# Patient Record
Sex: Female | Born: 1975 | Race: White | Hispanic: No | Marital: Married | Smoking: Never smoker
Health system: Southern US, Community
[De-identification: ages and names within clinical notes are randomized; demographics above are authoritative.]

## PROBLEM LIST (undated history)

## (undated) DIAGNOSIS — N200 Calculus of kidney: Secondary | ICD-10-CM

## (undated) HISTORY — DX: Calculus of kidney: N20.0

## (undated) HISTORY — PX: WISDOM TOOTH EXTRACTION: SHX21

---

## 1999-11-28 ENCOUNTER — Other Ambulatory Visit: Admission: RE | Admit: 1999-11-28 | Discharge: 1999-11-28 | Payer: Self-pay | Admitting: *Deleted

## 2000-12-24 ENCOUNTER — Other Ambulatory Visit: Admission: RE | Admit: 2000-12-24 | Discharge: 2000-12-24 | Payer: Self-pay | Admitting: *Deleted

## 2001-12-23 ENCOUNTER — Other Ambulatory Visit: Admission: RE | Admit: 2001-12-23 | Discharge: 2001-12-23 | Payer: Self-pay | Admitting: *Deleted

## 2003-02-23 ENCOUNTER — Inpatient Hospital Stay (HOSPITAL_COMMUNITY): Admission: AD | Admit: 2003-02-23 | Discharge: 2003-02-24 | Payer: Self-pay | Admitting: Obstetrics and Gynecology

## 2003-03-16 ENCOUNTER — Encounter: Admission: RE | Admit: 2003-03-16 | Discharge: 2003-03-16 | Payer: Self-pay | Admitting: Obstetrics and Gynecology

## 2003-04-15 ENCOUNTER — Inpatient Hospital Stay (HOSPITAL_COMMUNITY): Admission: AD | Admit: 2003-04-15 | Discharge: 2003-04-15 | Payer: Self-pay | Admitting: *Deleted

## 2003-04-20 ENCOUNTER — Inpatient Hospital Stay (HOSPITAL_COMMUNITY): Admission: AD | Admit: 2003-04-20 | Discharge: 2003-04-20 | Payer: Self-pay | Admitting: Obstetrics and Gynecology

## 2003-04-26 ENCOUNTER — Inpatient Hospital Stay (HOSPITAL_COMMUNITY): Admission: RE | Admit: 2003-04-26 | Discharge: 2003-04-28 | Payer: Self-pay | Admitting: Obstetrics and Gynecology

## 2003-06-08 ENCOUNTER — Other Ambulatory Visit: Admission: RE | Admit: 2003-06-08 | Discharge: 2003-06-08 | Payer: Self-pay | Admitting: Obstetrics and Gynecology

## 2004-08-21 ENCOUNTER — Other Ambulatory Visit: Admission: RE | Admit: 2004-08-21 | Discharge: 2004-08-21 | Payer: Self-pay | Admitting: Obstetrics and Gynecology

## 2005-01-02 ENCOUNTER — Encounter: Admission: RE | Admit: 2005-01-02 | Discharge: 2005-01-02 | Payer: Self-pay | Admitting: Obstetrics and Gynecology

## 2005-01-30 ENCOUNTER — Inpatient Hospital Stay (HOSPITAL_COMMUNITY): Admission: AD | Admit: 2005-01-30 | Discharge: 2005-01-30 | Payer: Self-pay | Admitting: Obstetrics and Gynecology

## 2005-02-10 ENCOUNTER — Inpatient Hospital Stay (HOSPITAL_COMMUNITY): Admission: AD | Admit: 2005-02-10 | Discharge: 2005-02-10 | Payer: Self-pay | Admitting: Obstetrics and Gynecology

## 2005-02-16 ENCOUNTER — Inpatient Hospital Stay (HOSPITAL_COMMUNITY): Admission: RE | Admit: 2005-02-16 | Discharge: 2005-02-18 | Payer: Self-pay | Admitting: Obstetrics and Gynecology

## 2005-04-09 ENCOUNTER — Other Ambulatory Visit: Admission: RE | Admit: 2005-04-09 | Discharge: 2005-04-09 | Payer: Self-pay | Admitting: Obstetrics and Gynecology

## 2009-05-04 ENCOUNTER — Ambulatory Visit (HOSPITAL_COMMUNITY): Admission: RE | Admit: 2009-05-04 | Discharge: 2009-05-04 | Payer: Self-pay | Admitting: Obstetrics and Gynecology

## 2009-05-25 ENCOUNTER — Ambulatory Visit (HOSPITAL_COMMUNITY): Admission: RE | Admit: 2009-05-25 | Discharge: 2009-05-25 | Payer: Self-pay | Admitting: Obstetrics and Gynecology

## 2009-06-22 ENCOUNTER — Ambulatory Visit (HOSPITAL_COMMUNITY): Admission: RE | Admit: 2009-06-22 | Discharge: 2009-06-22 | Payer: Self-pay | Admitting: Obstetrics and Gynecology

## 2009-07-13 ENCOUNTER — Ambulatory Visit (HOSPITAL_COMMUNITY): Admission: RE | Admit: 2009-07-13 | Discharge: 2009-07-13 | Payer: Self-pay | Admitting: Obstetrics and Gynecology

## 2009-08-24 ENCOUNTER — Ambulatory Visit (HOSPITAL_COMMUNITY): Admission: RE | Admit: 2009-08-24 | Discharge: 2009-08-24 | Payer: Self-pay | Admitting: Obstetrics and Gynecology

## 2009-09-25 ENCOUNTER — Inpatient Hospital Stay (HOSPITAL_COMMUNITY): Admission: AD | Admit: 2009-09-25 | Discharge: 2009-09-26 | Payer: Self-pay | Admitting: Obstetrics and Gynecology

## 2009-09-30 ENCOUNTER — Encounter (INDEPENDENT_AMBULATORY_CARE_PROVIDER_SITE_OTHER): Payer: Self-pay | Admitting: Obstetrics and Gynecology

## 2009-09-30 ENCOUNTER — Inpatient Hospital Stay (HOSPITAL_COMMUNITY): Admission: RE | Admit: 2009-09-30 | Discharge: 2009-10-03 | Payer: Self-pay | Admitting: Obstetrics and Gynecology

## 2010-05-28 ENCOUNTER — Encounter: Payer: Self-pay | Admitting: Obstetrics and Gynecology

## 2010-07-24 LAB — CBC
HCT: 27.4 % — ABNORMAL LOW (ref 36.0–46.0)
MCHC: 34.1 g/dL (ref 30.0–36.0)
MCHC: 34.6 g/dL (ref 30.0–36.0)
MCHC: 34.6 g/dL (ref 30.0–36.0)
MCV: 85.4 fL (ref 78.0–100.0)
MCV: 85.4 fL (ref 78.0–100.0)
MCV: 86.3 fL (ref 78.0–100.0)
Platelets: 91 10*3/uL — ABNORMAL LOW (ref 150–400)
Platelets: 98 10*3/uL — ABNORMAL LOW (ref 150–400)
RBC: 3.05 MIL/uL — ABNORMAL LOW (ref 3.87–5.11)
RBC: 3.85 MIL/uL — ABNORMAL LOW (ref 3.87–5.11)
WBC: 7.6 10*3/uL (ref 4.0–10.5)
WBC: 9.3 10*3/uL (ref 4.0–10.5)

## 2010-07-24 LAB — BASIC METABOLIC PANEL
CO2: 23 mEq/L (ref 19–32)
Calcium: 8.7 mg/dL (ref 8.4–10.5)
Creatinine, Ser: 0.41 mg/dL (ref 0.4–1.2)
GFR calc non Af Amer: >60 mL/min (ref >60)
Glucose, Bld: 68 mg/dL — ABNORMAL LOW (ref 70–99)

## 2010-07-24 LAB — PLATELET COUNT: Platelets: 107 10*3/uL — ABNORMAL LOW (ref 150–400)

## 2010-07-24 LAB — GLUCOSE, CAPILLARY: Glucose-Capillary: 97 mg/dL (ref 70–99)

## 2010-07-24 LAB — RPR: RPR Ser Ql: NONREACTIVE

## 2010-09-22 NOTE — Discharge Summary (Signed)
Wendy Peck, Wendy Peck                 ACCOUNT NO.:  [id number]   MEDICAL RECORD NO.:  [id number]          PATIENT TYPE:  INP   LOCATION:  9120                          FACILITY:  [facility]   PHYSICIAN:  Guy Sandifer. Henderson Cloud, M.D. DATE OF BIRTH:  June 05, 1975   DATE OF ADMISSION:  02/16/2005  DATE OF DISCHARGE:  02/18/2005                                 DISCHARGE SUMMARY   ADMITTING DIAGNOSES:  1.  Intrauterine pregnancy at 37 weeks estimated gestational age.  2.  History of coccygeal fracture, desires operative delivery.  3.  Advanced cervical dilatation.  4.  Thrombocytopenia   DISCHARGE DIAGNOSES:  1.  Status post low transverse cesarean section.  2.  Viable female infant.   PROCEDURE:  Primary low transverse cesarean section.   REASON FOR ADMISSION:  Please see written H&P.   HOSPITAL COURSE:  The patient is a 28-year gravida 2 para 1 that had been  seen in the office and was noted to be 4 cm dilated. The patient had had a  history of a coccygeal fracture with her last pregnancy and refused labor at  this time. The patient was also noted to have low platelet count with a  history of early pregnancy-induced hypertension with her previous pregnancy.  Vital signs were otherwise stable. The patient was now admitted to  [facility] for cesarean delivery. The patient was taken to  the operating room where spinal anesthesia was administered without  difficulty. A low transverse incision was made with the delivery of a viable  female infant weighing 9 pounds 0 ounces with Apgars of 9 at one minute and  9 at five minutes. The patient tolerated the procedure well and was taken to  the recovery room in stable condition. On postoperative day #1 the patient  was tolerating liquids without nausea or vomiting. Vital signs were stable  with blood pressure 108/62 to 128/86. Lungs were clear to auscultation.  Abdomen was soft with good return of bowel function. Laboratory findings  revealed hemoglobin of 9.1; platelet count was up to 131,000. On  postoperative day #2 the patient was feeling well and desired early  discharge. Vital signs were stable, she was afebrile. Incision was clean,  dry and intact. Abdomen was soft. Fundus firm and nontender. Discharge  instructions were reviewed and the patient was discharged home.   CONDITION ON DISCHARGE:  Good.   DIET:  Regular as tolerated.   ACTIVITY:  No heavy lifting, no driving x2 weeks, no vaginal entry.   FOLLOW-UP:  The patient is to follow up in the office in 1-2 weeks for an  incision check. She is to call for temperature greater than 100 degrees,  persistent nausea and vomiting, heavy vaginal bleeding, and/or redness or  drainage from the incisional site.   DISCHARGE MEDICATIONS:  1.  Percocet 5/325 #30 one p.o. q.4-6h. p.r.n.  2.  Motrin 600 mg q.6h. p.r.n.  3.  Prenatal vitamins one p.o. daily.  4.  Colace one p.o. daily p.r.n.      Julio Sicks, N.P.      Guy Sandifer. Henderson Cloud,  M.D.  Electronically Signed    CC/MEDQ  D:  03/12/2005  T:  03/12/2005  Job:  [id number]

## 2010-09-22 NOTE — Op Note (Signed)
Wendy Peck, Wendy Peck                 ACCOUNT NO.:  [id number]   MEDICAL RECORD NO.:  [id number]          PATIENT TYPE:  INP   LOCATION:  9120                          FACILITY:  [facility]   PHYSICIAN:  Michelle L. Grewal, M.D.DATE OF BIRTH:  [birth year]   DATE OF PROCEDURE:  02/16/2005  DATE OF DISCHARGE:                                 OPERATIVE REPORT   PREOPERATIVE DIAGNOSES:  1.  Intrauterine pregnancy at 37 weeks.  2.  History of coccygeal fracture.  3.  Advanced cervical dilation.  4.  Thrombocytopenia.   POSTOPERATIVE DIAGNOSES:  1.  Intrauterine pregnancy at 37 weeks.  2.  History of coccygeal fracture.  3.  Advanced cervical dilation.  4.  Thrombocytopenia.   PROCEDURE:  Primary low transverse cesarean section.   SURGEON:  Michelle L. Vincente Poli, M.D.   ANESTHESIA:  Spinal.   SPECIMENS:  Female infant in cephalic presentation, Apgars 9 at one minute  and 9 at five minutes.   ESTIMATED BLOOD LOSS:  500 cc.   DRAINS:  Foley.   COMPLICATIONS:  None.   PATHOLOGY.:  None.   PROCEDURE:  The patient was taken to the operating room.  She was given  spinal without incident.  She was prepped and draped in usual sterile  fashion.  A Foley catheter was inserted.  A sterile drape was applied.  A  low transverse was incision made and carried down to the fascia.  The fascia  was scored in the midline and extended laterally.  The rectus muscles were  separated in the midline and the peritoneum was entered bluntly and the  peritoneal incision was then stretched.  The bladder blade was inserted, the  lower uterine segment was identified, and the bladder flap was created  sharply and then digitally.  The bladder blade was then readjusted and a low  transverse incision was made in the uterus.  The uterus was entered using a  hemostat.  The amniotic fluid was clear.  The baby was in cephalic  presentation, was delivered easily with a vacuum extractor.  It was a female  infant, Apgars 9 at  one minute and 9 at five minutes.  The cord was clamped  and cut.  The placenta was manually removed and noted to be normal and  intact with a three-vessel cord.  The uterus was cleared of all clots and  debris.  The uterine incision was closed in a single layer using 0 chromic  in a continuous running locked stitch.  Irrigation was performed.  Hemostasis was again noted.  The peritoneum was closed using 0 Vicryl in a  continuous running stitch  and the rectus muscles were reapproximated using the same 0 Vicryl.  The  fascia was closed using 0 Vicryl in a continuous running stitch.  After  irrigation of the subcutaneous layer, the skin was closed with staples.  All  sponge, lap and instrument counts were correct x2.  The patient went to  recovery room in stable condition.      Michelle L. Vincente Poli, M.D.  Electronically Signed     MLG/MEDQ  D:  02/16/2005  T:  02/17/2005  Job:  [id number]

## 2012-01-14 IMAGING — US US OB FOLLOW-UP
1 series · 14 of 28 positions shown · non-contrast
Comparison: none

OBSTETRICAL ULTRASOUND:
 This ultrasound was performed in The [HOSPITAL], and the [facility] report will be stored to [REDACTED] PACS.  This report is also available in [HOSPITAL]?s accessANYware.

[Series 1: us ob follow-up · 38 acquisitions, 14 frames shown]
[im 2/38]
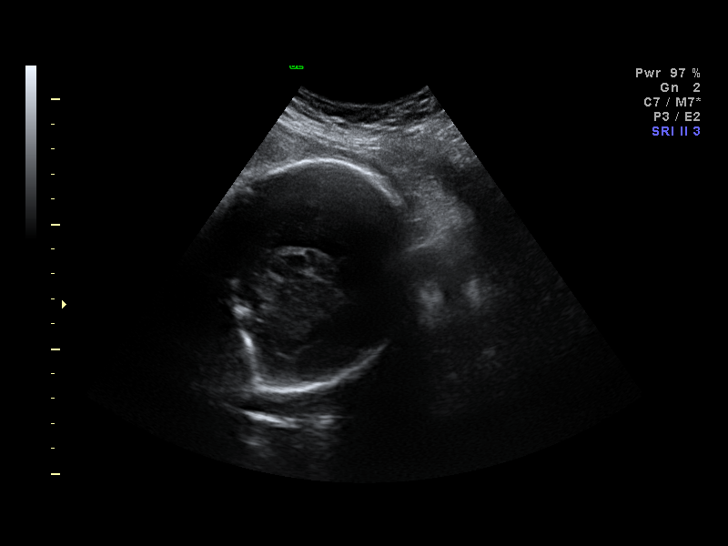
[im 5/38]
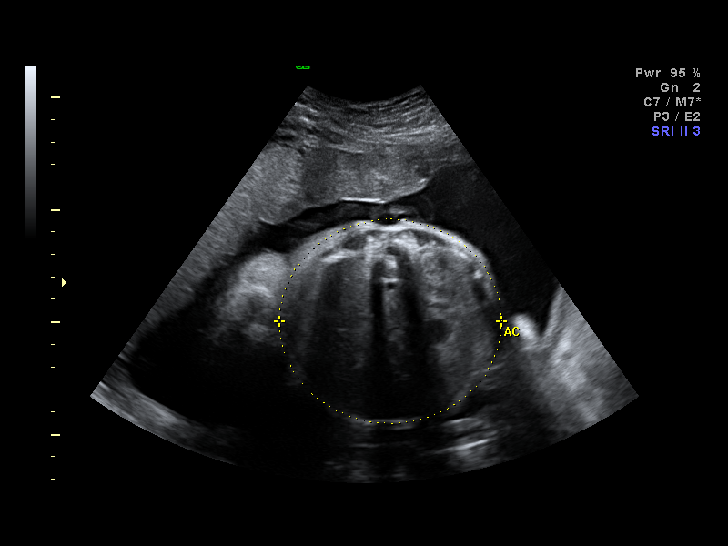
[im 7/38]
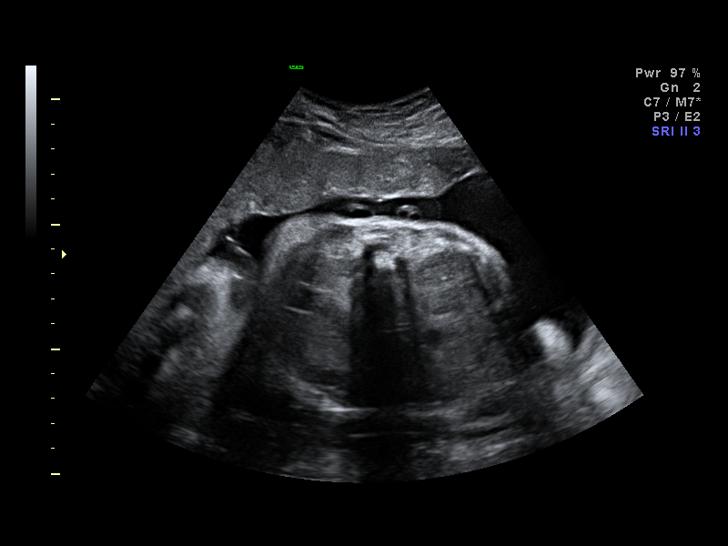
[im 10/38]
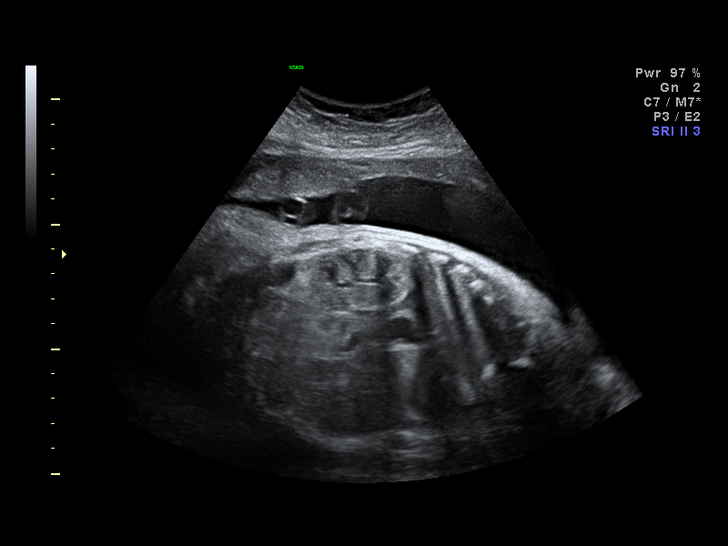
[im 13/38]
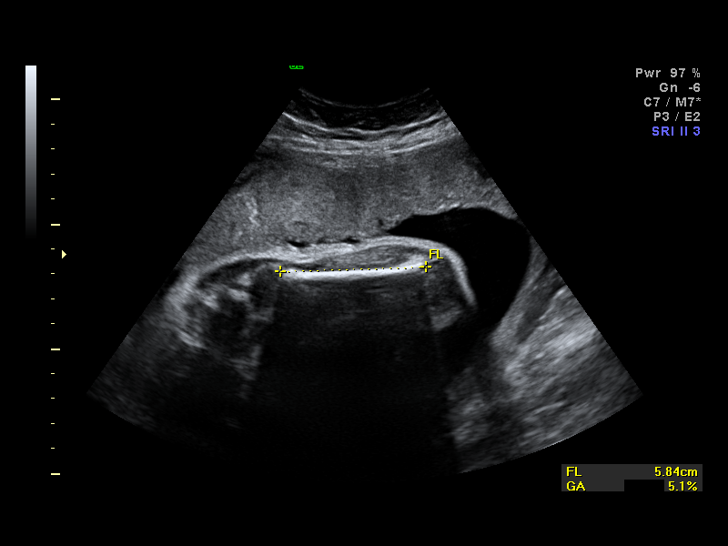
[im 16/38]
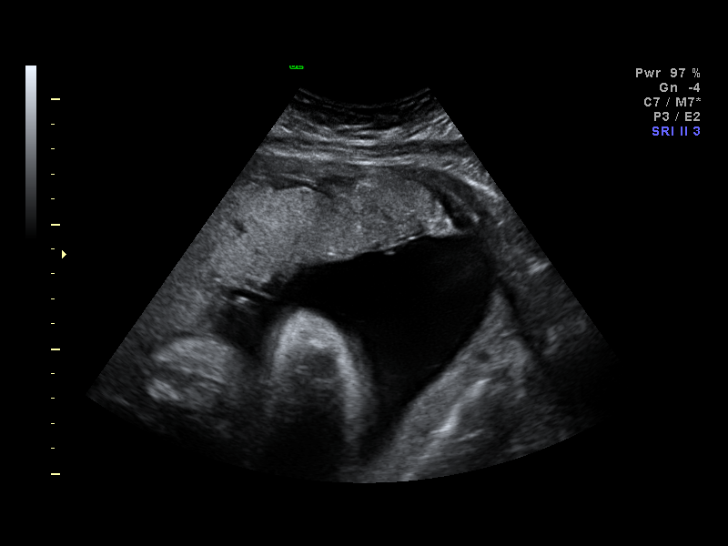
[im 18/38]
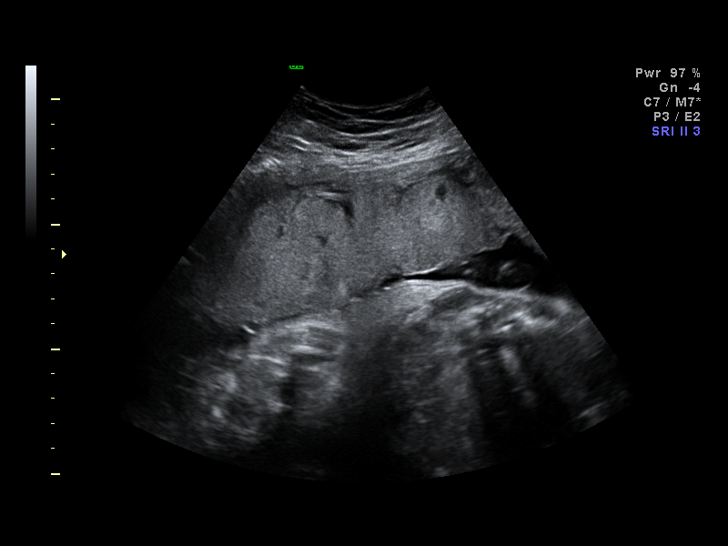
[im 21/38]
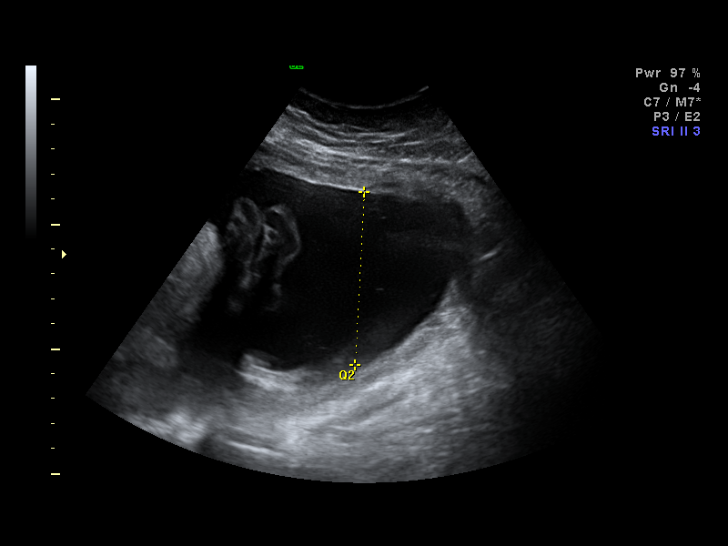
[im 24/38]
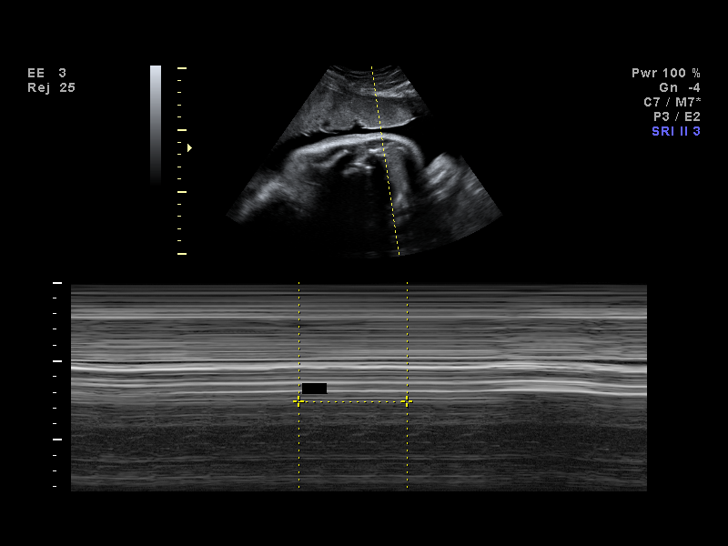
[im 27/38]
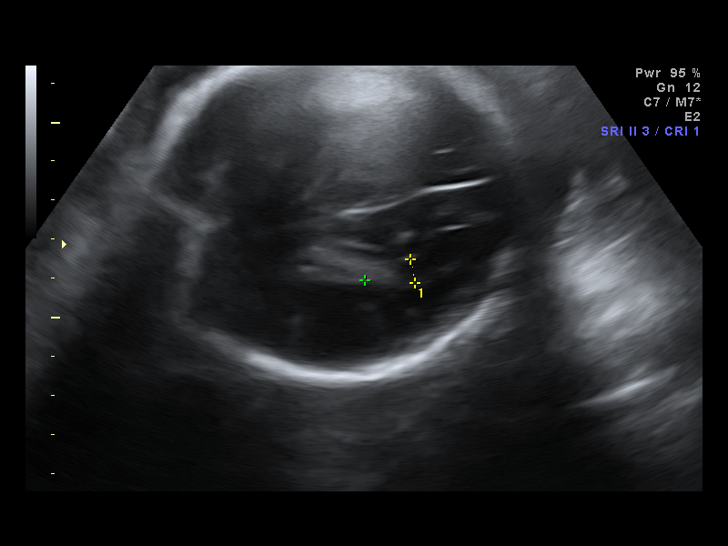
[im 29/38]
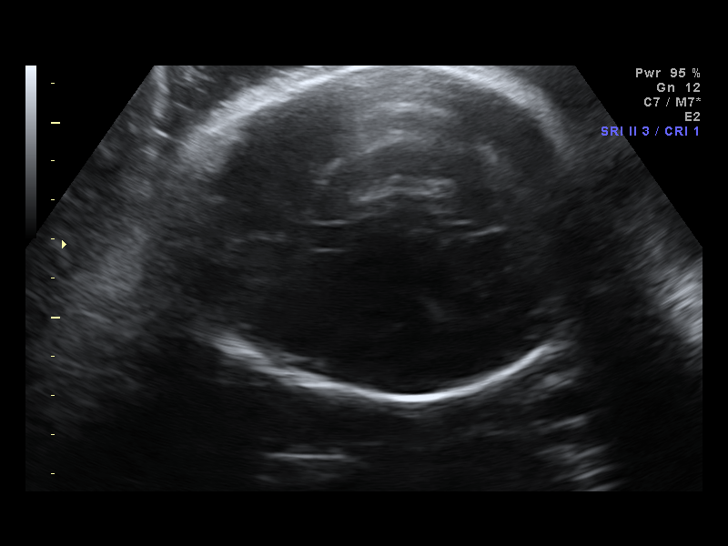
[im 32/38]
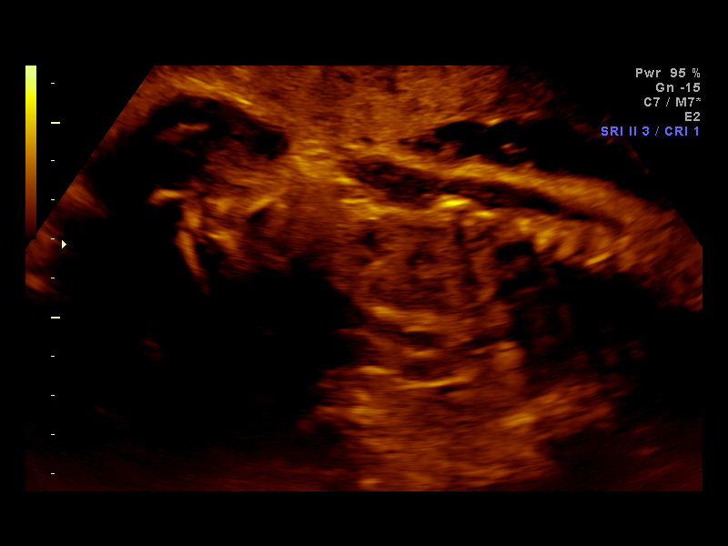
[im 35/38]
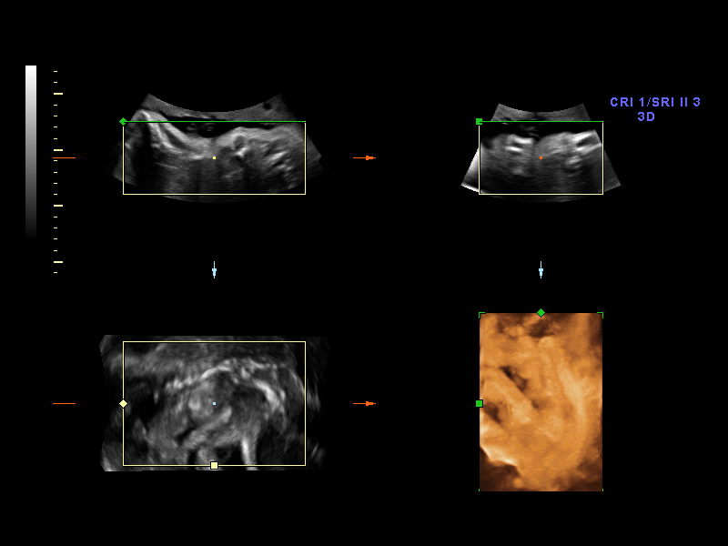
[im 38/38]
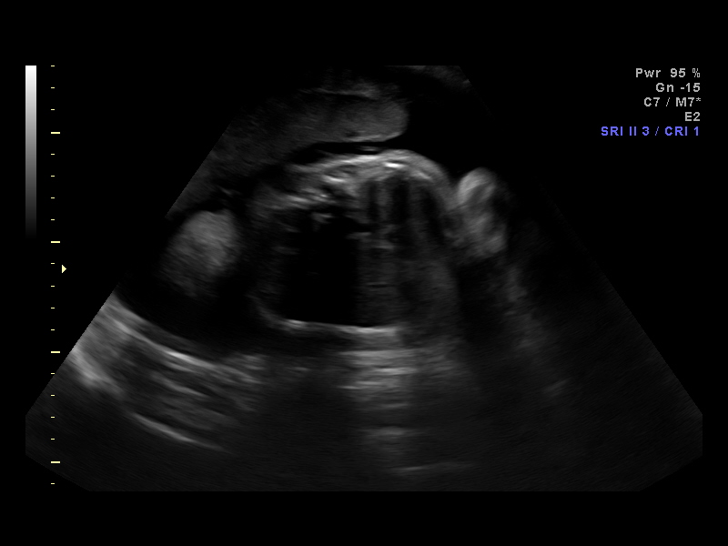

[14 of 28 positions shown; findings below may reference images not displayed]

IMPRESSION: [facility] has also been faxed to the ordering physician.

## 2012-04-07 ENCOUNTER — Other Ambulatory Visit: Payer: Self-pay | Admitting: Obstetrics and Gynecology

## 2013-05-28 ENCOUNTER — Other Ambulatory Visit: Payer: Self-pay | Admitting: Obstetrics and Gynecology

## 2014-06-02 ENCOUNTER — Other Ambulatory Visit: Payer: Self-pay | Admitting: Obstetrics and Gynecology

## 2014-06-03 LAB — CYTOLOGY - PAP

## 2018-09-05 ENCOUNTER — Emergency Department (HOSPITAL_COMMUNITY)
Admission: EM | Admit: 2018-09-05 | Discharge: 2018-09-05 | Disposition: A | Discharge status: Home / Self Care | Payer: No Typology Code available for payment source | Attending: Emergency Medicine | Admitting: Emergency Medicine

## 2018-09-05 ENCOUNTER — Encounter (HOSPITAL_COMMUNITY): Payer: Self-pay | Admitting: Emergency Medicine

## 2018-09-05 ENCOUNTER — Emergency Department (HOSPITAL_COMMUNITY): Payer: No Typology Code available for payment source

## 2018-09-05 ENCOUNTER — Other Ambulatory Visit: Payer: Self-pay

## 2018-09-05 DIAGNOSIS — N130 Hydronephrosis with ureteropelvic junction obstruction: Secondary | ICD-10-CM | POA: Diagnosis not present

## 2018-09-05 DIAGNOSIS — R112 Nausea with vomiting, unspecified: Secondary | ICD-10-CM | POA: Diagnosis not present

## 2018-09-05 DIAGNOSIS — N200 Calculus of kidney: Secondary | ICD-10-CM

## 2018-09-05 DIAGNOSIS — R1031 Right lower quadrant pain: Secondary | ICD-10-CM | POA: Diagnosis present

## 2018-09-05 DIAGNOSIS — N202 Calculus of kidney with calculus of ureter: Secondary | ICD-10-CM | POA: Diagnosis not present

## 2018-09-05 DIAGNOSIS — N134 Hydroureter: Secondary | ICD-10-CM | POA: Diagnosis not present

## 2018-09-05 LAB — COMPREHENSIVE METABOLIC PANEL
ALT: 22 U/L (ref 0–44)
AST: 19 U/L (ref 15–41)
Albumin: 4.4 g/dL (ref 3.5–5.0)
Alkaline Phosphatase: 49 U/L (ref 38–126)
Anion gap: 14 (ref 5–15)
BUN: 13 mg/dL (ref 6–20)
CO2: 22 mmol/L (ref 22–32)
Calcium: 9.6 mg/dL (ref 8.9–10.3)
Chloride: 103 mmol/L (ref 98–111)
Creatinine, Ser: 0.92 mg/dL (ref 0.44–1.00)
GFR calc Af Amer: >60 mL/min (ref >60)
GFR calc non Af Amer: >60 mL/min (ref >60)
Glucose, Bld: 191 mg/dL — ABNORMAL HIGH (ref 70–99)
Potassium: 3.7 mmol/L (ref 3.5–5.1)
Sodium: 139 mmol/L (ref 135–145)
Total Bilirubin: 0.4 mg/dL (ref 0.3–1.2)
Total Protein: 7.2 g/dL (ref 6.5–8.1)

## 2018-09-05 LAB — CBC WITH DIFFERENTIAL/PLATELET
Abs Immature Granulocytes: 0.04 10*3/uL (ref 0.00–0.07)
Basophils Absolute: 0 10*3/uL (ref 0.0–0.1)
Basophils Relative: 0 %
Eosinophils Absolute: 0 10*3/uL (ref 0.0–0.5)
Eosinophils Relative: 0 %
HCT: 43.7 % (ref 36.0–46.0)
Hemoglobin: 14.1 g/dL (ref 12.0–15.0)
Immature Granulocytes: 0 %
Lymphocytes Relative: 14 %
Lymphs Abs: 1.3 10*3/uL (ref 0.7–4.0)
MCH: 28.6 pg (ref 26.0–34.0)
MCHC: 32.3 g/dL (ref 30.0–36.0)
MCV: 88.6 fL (ref 80.0–100.0)
Monocytes Absolute: 0.5 10*3/uL (ref 0.1–1.0)
Monocytes Relative: 5 %
Neutro Abs: 7.2 10*3/uL (ref 1.7–7.7)
Neutrophils Relative %: 81 %
Platelets: 183 10*3/uL (ref 150–400)
RBC: 4.93 MIL/uL (ref 3.87–5.11)
RDW: 13.2 % (ref 11.5–15.5)
WBC: 9 10*3/uL (ref 4.0–10.5)
nRBC: 0 % (ref 0.0–0.2)

## 2018-09-05 LAB — WET PREP, GENITAL
Clue Cells Wet Prep HPF POC: NONE SEEN
Trich, Wet Prep: NONE SEEN
Yeast Wet Prep HPF POC: NONE SEEN

## 2018-09-05 LAB — LIPASE, BLOOD: Lipase: 37 U/L (ref 11–51)

## 2018-09-05 LAB — GC/CHLAMYDIA PROBE AMP (~~LOC~~) NOT AT ARMC
Chlamydia: NEGATIVE
Neisseria Gonorrhea: NEGATIVE

## 2018-09-05 LAB — URINALYSIS, ROUTINE W REFLEX MICROSCOPIC
Bacteria, UA: NONE SEEN
Bilirubin Urine: NEGATIVE
Glucose, UA: 150 mg/dL — AB
Ketones, ur: 5 mg/dL — AB
Leukocytes,Ua: NEGATIVE
Nitrite: NEGATIVE
Protein, ur: NEGATIVE mg/dL
Specific Gravity, Urine: 1.012 (ref 1.005–1.030)
pH: 7 (ref 5.0–8.0)

## 2018-09-05 LAB — I-STAT BETA HCG BLOOD, ED (MC, WL, AP ONLY): I-stat hCG, quantitative: <5 m[IU]/mL (ref <5)

## 2018-09-05 MED ORDER — TAMSULOSIN HCL 0.4 MG PO CAPS: 0.4000 mg | ORAL_CAPSULE | Freq: Every day | ORAL | 0 refills | Status: DC

## 2018-09-05 MED ORDER — HYDROMORPHONE HCL 1 MG/ML IJ SOLN
1.0000 mg | Freq: Once | INTRAMUSCULAR | Status: AC
  Administered 2018-09-05: 07:00:00 1 mg via INTRAVENOUS
  Filled 2018-09-05: qty 1

## 2018-09-05 MED ORDER — ONDANSETRON 4 MG PO TBDP: 4.0000 mg | ORAL_TABLET | Freq: Four times a day (QID) | ORAL | 0 refills | Status: DC | PRN

## 2018-09-05 MED ORDER — SODIUM CHLORIDE 0.9 % IV BOLUS (SEPSIS)
1000.0000 mL | Freq: Once | INTRAVENOUS | Status: AC
  Administered 2018-09-05: 05:00:00 1000 mL via INTRAVENOUS

## 2018-09-05 MED ORDER — ONDANSETRON HCL 4 MG/2ML IJ SOLN
4.0000 mg | Freq: Once | INTRAMUSCULAR | Status: AC
  Administered 2018-09-05: 4 mg via INTRAVENOUS
  Filled 2018-09-05: qty 2

## 2018-09-05 MED ORDER — OXYCODONE-ACETAMINOPHEN 5-325 MG PO TABS: 2.0000 | ORAL_TABLET | Freq: Four times a day (QID) | ORAL | 0 refills | Status: DC | PRN

## 2018-09-05 MED ORDER — HYDROMORPHONE HCL 1 MG/ML IJ SOLN
1.0000 mg | Freq: Once | INTRAMUSCULAR | Status: AC
  Administered 2018-09-05: 05:00:00 1 mg via INTRAVENOUS
  Filled 2018-09-05: qty 1

## 2018-09-05 MED ORDER — IBUPROFEN 800 MG PO TABS: 800.0000 mg | ORAL_TABLET | Freq: Three times a day (TID) | ORAL | 0 refills | Status: DC | PRN

## 2018-09-05 MED ORDER — KETOROLAC TROMETHAMINE 30 MG/ML IJ SOLN
30.0000 mg | Freq: Once | INTRAMUSCULAR | Status: AC
  Administered 2018-09-05: 05:00:00 30 mg via INTRAVENOUS
  Filled 2018-09-05: qty 1

## 2018-09-05 NOTE — ED Notes (Signed)
Patient transported to CT 

## 2018-09-05 NOTE — ED Notes (Signed)
Pt reports 2/10 pain after Toradol injection

## 2018-09-05 NOTE — ED Notes (Signed)
Patient verbalizes understanding of discharge instructions. Opportunity for questioning and answers were provided. Pt discharged from ED. 

## 2018-09-05 NOTE — ED Triage Notes (Signed)
Pt in with c/o RLQ pain since 2300 last night, states pain has gotten sharper and more constant in past few hours. Emesis x 1, denies any fevers or urinary symptoms.

## 2018-09-05 NOTE — ED Provider Notes (Signed)
TIME SEEN: 4:32 AM  CHIEF COMPLAINT: right sided abdominal pain  HPI: Patient is a 43 year old female with no significant past medical history who is status post 2 C-sections, BTL who presents to the emergency department with sudden onset right lower quadrant pain.  States it started around 9 PM and was mild and then significantly worsened at 11 PM.  She has had nausea and vomiting.  No diarrhea.  No dysuria, hematuria, vaginal bleeding or discharge.  She just finished her menstrual cycle.  No history of ovarian cyst, kidney stone.  No chest pain, shortness of breath, cough.  ROS: See HPI Constitutional: no fever  Eyes: no drainage  ENT: no runny nose   Cardiovascular:  no chest pain  Resp: no SOB  GI:  vomiting GU: no dysuria Integumentary: no rash  Allergy: no hives  Musculoskeletal: no leg swelling  Neurological: no slurred speech ROS otherwise negative  PAST MEDICAL HISTORY/PAST SURGICAL HISTORY:  History reviewed. No pertinent past medical history.  MEDICATIONS:  Prior to Admission medications   Not on File    ALLERGIES:  Not on File  SOCIAL HISTORY:  Social History   Tobacco Use  . Smoking status: Not on file  Substance Use Topics  . Alcohol use: Not on file    FAMILY HISTORY: No family history on file.  EXAM: Pulse (!) 55   Temp 97.6 F (36.4 C) (Oral)   Wt 77.1 kg   LMP 08/29/2018   SpO2 99%  CONSTITUTIONAL: Alert and oriented and responds appropriately to questions.  Appears very uncomfortable, moaning in pain HEAD: Normocephalic EYES: Conjunctivae clear, pupils appear equal, EOMI ENT: normal nose; moist mucous membranes NECK: Supple, no meningismus, no nuchal rigidity, no LAD  CARD: RRR; S1 and S2 appreciated; no murmurs, no clicks, no rubs, no gallops RESP: Normal chest excursion without splinting or tachypnea; breath sounds clear and equal bilaterally; no wheezes, no rhonchi, no rales, no hypoxia or respiratory distress, speaking full  sentences ABD/GI: Normal bowel sounds; non-distended; soft, palpation at McBurney's point with voluntary guarding, negative Murphy sign GU:  Normal external genitalia. No lesions, rashes noted. Patient has no vaginal bleeding on exam.  No vaginal discharge.  No adnexal tenderness, mass or fullness, no cervical motion tenderness. Cervix is not appear friable.  Cervix is closed.  Chaperone present for exam. BACK:  The back appears normal and is non-tender to palpation, there is no CVA tenderness EXT: Normal ROM in all joints; non-tender to palpation; no edema; normal capillary refill; no cyanosis, no calf tenderness or swelling    SKIN: Normal color for age and race; warm; no rash NEURO: Moves all extremities equally PSYCH: The patient's mood and manner are appropriate. Grooming and personal hygiene are appropriate.  MEDICAL DECISION MAKING: Patient here with sudden onset severe right lower quadrant pain.  Differential includes kidney stone, ovarian torsion, ruptured ovarian cyst, appendicitis, UTI, pyelonephritis, bowel perforation.  Will obtain labs, urine, pelvic cultures.  She does not have a significant adnexal tenderness on pelvic exam therefore will start with CT imaging.  Will give Dilaudid, Toradol, Zofran, IV fluids for symptomatic relief.  ED PROGRESS: Patient's urine shows no sign of infection.  CT scan shows an obstructing 3 mm stone at the right UVJ.  Pain is well controlled after 2 doses of Dilaudid, Toradol.  No longer vomiting.  Will discharge home with urine strainer, pain and nausea medicine on urology follow-up.  Patient comfortable with this plan.   At this time, I do not  feel there is any life-threatening condition present. I have reviewed and discussed all results (EKG, imaging, lab, urine as appropriate) and exam findings with patient/family. I have reviewed nursing notes and appropriate previous records.  I feel the patient is safe to be discharged home without further emergent  workup and can continue workup as an outpatient as needed. Discussed usual and customary return precautions. Patient/family verbalize understanding and are comfortable with this plan.  Outpatient follow-up has been provided as needed. All questions have been answered.      Sharice Harriss, Delice Bison, DO 09/05/18 [phone]

## 2021-03-24 ENCOUNTER — Other Ambulatory Visit: Payer: Self-pay

## 2021-03-24 ENCOUNTER — Ambulatory Visit
Admission: EM | Admit: 2021-03-24 | Discharge: 2021-03-24 | Disposition: A | Discharge status: Home / Self Care | Payer: No Typology Code available for payment source

## 2021-03-24 DIAGNOSIS — J209 Acute bronchitis, unspecified: Secondary | ICD-10-CM | POA: Diagnosis not present

## 2021-03-24 MED ORDER — ALBUTEROL SULFATE HFA 108 (90 BASE) MCG/ACT IN AERS: 1.0000 | INHALATION_SPRAY | Freq: Four times a day (QID) | RESPIRATORY_TRACT | 0 refills | Status: DC | PRN

## 2021-03-24 MED ORDER — PREDNISONE 20 MG PO TABS: 40.0000 mg | ORAL_TABLET | Freq: Every day | ORAL | 0 refills | Status: DC

## 2021-03-24 MED ORDER — DEXAMETHASONE SODIUM PHOSPHATE 10 MG/ML IJ SOLN
10.0000 mg | Freq: Once | INTRAMUSCULAR | Status: AC
  Administered 2021-03-24: 10 mg via INTRAMUSCULAR

## 2021-03-24 NOTE — ED Triage Notes (Signed)
Pt presents with continued cough, mucous production, sore throat, and headache x 12 days/ Pt seen last week at an urgent care and was prescribed amoxicillin and has no improvement. Pt requesting steroid.

## 2021-03-24 NOTE — ED Provider Notes (Signed)
[facility]    CSN: [id number] Arrival date & time: 03/24/21  1314      History   Chief Complaint Chief Complaint  Patient presents with   Cough   Headache    HPI Wendy Peck is a 45 y.o. female.   Patient presenting today with 12-day history of ongoing persistent sinus pressure, congestion, hacking cough, chest tightness, fatigue.  States she was seen around day 5 by another provider and was given amoxicillin which has provided absolutely no benefit.  She is taking her Zyrtec, Mucinex, her mom's albuterol inhaler, over-the-counter cold and congestion medications with minimal relief.  She does note that the albuterol inhaler has been the most helpful thing of all so far for her.  She denies fever, chills, body aches, chest pain, significant shortness of breath.   History reviewed. No pertinent past medical history.  There are no problems to display for this patient.   Past Surgical History:  Procedure Laterality Date   CESAREAN SECTION      OB History   No obstetric history on file.      Home Medications    Prior to Admission medications   Medication Sig Start Date End Date Taking? Authorizing Provider  albuterol (VENTOLIN HFA) 108 (90 Base) MCG/ACT inhaler Inhale 1-2 puffs into the lungs every 6 (six) hours as needed for wheezing or shortness of breath. 03/24/21  Yes Volney American, PA-C  cetirizine (ZYRTEC) 10 MG chewable tablet Chew 10 mg by mouth daily.   Yes [provider]  Phenylephrine-Chlorphen-DM (ROBITUSSIN COUGH/ALLERGY PO) Take by mouth.   Yes [provider]  predniSONE (DELTASONE) 20 MG tablet Take 2 tablets (40 mg total) by mouth daily with breakfast. 03/24/21  Yes Volney American, PA-C  ibuprofen (ADVIL) 800 MG tablet Take 1 tablet (800 mg total) by mouth every 8 (eight) hours as needed for mild pain. 09/05/18   Ward, Delice Bison, DO  ondansetron (ZOFRAN ODT) 4 MG disintegrating tablet Take 1 tablet (4 mg  total) by mouth every 6 (six) hours as needed. Patient not taking: Reported on 03/24/2021 09/05/18   Ward, Delice Bison, DO  oxyCODONE-acetaminophen (PERCOCET/ROXICET) 5-325 MG tablet Take 2 tablets by mouth every 6 (six) hours as needed for severe pain. Patient not taking: Reported on 03/24/2021 09/05/18   Ward, Delice Bison, DO  tamsulosin (FLOMAX) 0.4 MG CAPS capsule Take 1 capsule (0.4 mg total) by mouth daily. Take until your stone passes. Patient not taking: Reported on 03/24/2021 09/05/18   Ward, Delice Bison, DO    Family History History reviewed. No pertinent family history.  Social History Social History   Tobacco Use   Smoking status: Never   Smokeless tobacco: Never  Substance Use Topics   Alcohol use: Not Currently   Drug use: Never     Allergies   Patient has no known allergies.   Review of Systems Review of Systems Per HPI  Physical Exam Triage Vital Signs ED Triage Vitals [03/24/21 1329]  Enc Vitals Group     BP 121/82     Pulse Rate 90     Resp 14     Temp 98.6 F (37 C)     Temp Source Oral     SpO2 95 %     Weight      Height      Head Circumference      Peak Flow      Pain Score 3     Pain Loc  Pain Edu?      Excl. in Gray?    No data found.  Updated Vital Signs BP 121/82 (BP Location: Left Arm)   Pulse 90   Temp 98.6 F (37 C) (Oral)   Resp 14   SpO2 95%   Visual Acuity Right Eye Distance:   Left Eye Distance:   Bilateral Distance:    Right Eye Near:   Left Eye Near:    Bilateral Near:     Physical Exam Vitals and nursing note reviewed.  Constitutional:      Appearance: Normal appearance. She is not ill-appearing.  HENT:     Head: Atraumatic.     Right Ear: Tympanic membrane normal.     Left Ear: Tympanic membrane normal.     Nose: Rhinorrhea present.     Mouth/Throat:     Mouth: Mucous membranes are moist.     Pharynx: Posterior oropharyngeal erythema present.  Eyes:     Extraocular Movements: Extraocular movements intact.      Conjunctiva/sclera: Conjunctivae normal.  Cardiovascular:     Rate and Rhythm: Normal rate and regular rhythm.     Heart sounds: Normal heart sounds.  Pulmonary:     Effort: Pulmonary effort is normal.     Breath sounds: Normal breath sounds. No wheezing or rales.  Musculoskeletal:        General: Normal range of motion.     Cervical back: Normal range of motion and neck supple.  Skin:    General: Skin is warm and dry.  Neurological:     Mental Status: She is alert and oriented to person, place, and time.  Psychiatric:        Mood and Affect: Mood normal.        Thought Content: Thought content normal.        Judgment: Judgment normal.     UC Treatments / Results  Labs (all labs ordered are listed, but only abnormal results are displayed) Labs Reviewed - No data to display  EKG   Radiology No results found.  Procedures Procedures (including critical care time)  Medications Ordered in UC Medications  dexamethasone (DECADRON) injection 10 mg (has no administration in time range)    Initial Impression / Assessment and Plan / UC Course  I have reviewed the triage vital signs and the nursing notes.  Pertinent labs & imaging results that were available during my care of the patient were reviewed by me and considered in my medical decision making (see chart for details).     No evidence of a bacterial infection today, suspect ongoing inflammatory symptoms.  Treat with IM Decadron, continued allergy regimen, Mucinex, supportive home care.  Albuterol inhaler sent as she states this has been really helpful for her.  Oral prednisone also sent in case not fully resolved after IM Decadron.  Return for acutely worsening symptoms.  Final Clinical Impressions(s) / UC Diagnoses   Final diagnoses:  Acute bronchitis, unspecified organism   Discharge Instructions   None    ED Prescriptions     Medication Sig Dispense Auth. Provider   predniSONE (DELTASONE) 20 MG tablet Take  2 tablets (40 mg total) by mouth daily with breakfast. 10 tablet Volney American, PA-C   albuterol (VENTOLIN HFA) 108 (90 Base) MCG/ACT inhaler Inhale 1-2 puffs into the lungs every 6 (six) hours as needed for wheezing or shortness of breath. 18 g Volney American, [place]      PDMP not reviewed this encounter.  Volney American, [place] 03/24/21 1629

## 2021-05-02 ENCOUNTER — Emergency Department (HOSPITAL_COMMUNITY): Payer: PRIVATE HEALTH INSURANCE

## 2021-05-02 ENCOUNTER — Other Ambulatory Visit: Payer: Self-pay

## 2021-05-02 ENCOUNTER — Emergency Department (HOSPITAL_COMMUNITY)
Admission: EM | Admit: 2021-05-02 | Discharge: 2021-05-02 | Disposition: A | Discharge status: Home / Self Care | Payer: PRIVATE HEALTH INSURANCE | Attending: Emergency Medicine | Admitting: Emergency Medicine

## 2021-05-02 DIAGNOSIS — Z79899 Other long term (current) drug therapy: Secondary | ICD-10-CM | POA: Insufficient documentation

## 2021-05-02 DIAGNOSIS — N200 Calculus of kidney: Secondary | ICD-10-CM | POA: Insufficient documentation

## 2021-05-02 DIAGNOSIS — R109 Unspecified abdominal pain: Secondary | ICD-10-CM | POA: Diagnosis present

## 2021-05-02 LAB — CBC WITH DIFFERENTIAL/PLATELET
Abs Immature Granulocytes: 0.04 10*3/uL (ref 0.00–0.07)
Basophils Absolute: 0.1 10*3/uL (ref 0.0–0.1)
Basophils Relative: 1 %
Eosinophils Absolute: 0.1 10*3/uL (ref 0.0–0.5)
Eosinophils Relative: 1 %
HCT: 43.8 % (ref 36.0–46.0)
Hemoglobin: 14.5 g/dL (ref 12.0–15.0)
Immature Granulocytes: 0 %
Lymphocytes Relative: 18 %
Lymphs Abs: 1.8 10*3/uL (ref 0.7–4.0)
MCH: 30 pg (ref 26.0–34.0)
MCHC: 33.1 g/dL (ref 30.0–36.0)
MCV: 90.7 fL (ref 80.0–100.0)
Monocytes Absolute: 0.5 10*3/uL (ref 0.1–1.0)
Monocytes Relative: 5 %
Neutro Abs: 7.7 10*3/uL (ref 1.7–7.7)
Neutrophils Relative %: 75 %
Platelets: 193 10*3/uL (ref 150–400)
RBC: 4.83 MIL/uL (ref 3.87–5.11)
RDW: 13.3 % (ref 11.5–15.5)
WBC: 10.2 10*3/uL (ref 4.0–10.5)
nRBC: 0 % (ref 0.0–0.2)

## 2021-05-02 LAB — COMPREHENSIVE METABOLIC PANEL
ALT: 24 U/L (ref 0–44)
AST: 21 U/L (ref 15–41)
Albumin: 4.6 g/dL (ref 3.5–5.0)
Alkaline Phosphatase: 56 U/L (ref 38–126)
Anion gap: 12 (ref 5–15)
BUN: 13 mg/dL (ref 6–20)
CO2: 23 mmol/L (ref 22–32)
Calcium: 9 mg/dL (ref 8.9–10.3)
Chloride: 100 mmol/L (ref 98–111)
Creatinine, Ser: 0.8 mg/dL (ref 0.44–1.00)
GFR, Estimated: >60 mL/min (ref >60)
Glucose, Bld: 133 mg/dL — ABNORMAL HIGH (ref 70–99)
Potassium: 3.4 mmol/L — ABNORMAL LOW (ref 3.5–5.1)
Sodium: 135 mmol/L (ref 135–145)
Total Bilirubin: 0.5 mg/dL (ref 0.3–1.2)
Total Protein: 7.5 g/dL (ref 6.5–8.1)

## 2021-05-02 LAB — URINALYSIS, ROUTINE W REFLEX MICROSCOPIC
Bilirubin Urine: NEGATIVE
Glucose, UA: 50 mg/dL — AB
Ketones, ur: 20 mg/dL — AB
Leukocytes,Ua: NEGATIVE
Nitrite: NEGATIVE
Protein, ur: NEGATIVE mg/dL
Specific Gravity, Urine: 1.014 (ref 1.005–1.030)
pH: 7 (ref 5.0–8.0)

## 2021-05-02 LAB — HCG, QUANTITATIVE, PREGNANCY: hCG, Beta Chain, Quant, S: <1 m[IU]/mL (ref <5)

## 2021-05-02 MED ORDER — HYDROMORPHONE HCL 1 MG/ML IJ SOLN
0.5000 mg | Freq: Once | INTRAMUSCULAR | Status: AC
  Administered 2021-05-02: 15:00:00 0.5 mg via INTRAVENOUS
  Filled 2021-05-02: qty 1

## 2021-05-02 MED ORDER — KETOROLAC TROMETHAMINE 30 MG/ML IJ SOLN
15.0000 mg | Freq: Once | INTRAMUSCULAR | Status: AC
  Administered 2021-05-02: 18:00:00 15 mg via INTRAVENOUS
  Filled 2021-05-02: qty 1

## 2021-05-02 MED ORDER — OXYCODONE-ACETAMINOPHEN 5-325 MG PO TABS
1.0000 | ORAL_TABLET | Freq: Once | ORAL | Status: AC
  Administered 2021-05-02: 21:00:00 1 via ORAL
  Filled 2021-05-02: qty 1

## 2021-05-02 MED ORDER — ONDANSETRON 4 MG PO TBDP: 4.0000 mg | ORAL_TABLET | Freq: Three times a day (TID) | ORAL | 0 refills | Status: DC | PRN

## 2021-05-02 MED ORDER — OXYCODONE-ACETAMINOPHEN 5-325 MG PO TABS: 1.0000 | ORAL_TABLET | Freq: Three times a day (TID) | ORAL | 0 refills | Status: DC | PRN

## 2021-05-02 MED ORDER — HYDROMORPHONE HCL 1 MG/ML IJ SOLN
1.0000 mg | Freq: Once | INTRAMUSCULAR | Status: AC
  Administered 2021-05-02: 16:00:00 1 mg via INTRAVENOUS
  Filled 2021-05-02: qty 1

## 2021-05-02 MED ORDER — ONDANSETRON HCL 4 MG/2ML IJ SOLN
4.0000 mg | Freq: Once | INTRAMUSCULAR | Status: AC
  Administered 2021-05-02: 15:00:00 4 mg via INTRAVENOUS
  Filled 2021-05-02: qty 2

## 2021-05-02 NOTE — ED Provider Notes (Signed)
[facility] EMERGENCY DEPARTMENT Provider Note   CSN: [id number] Arrival date & time: 05/02/21  1407     History Chief Complaint  Patient presents with   Flank Pain    Wendy Peck is a 45 y.o. female.   Flank Pain Associated symptoms include abdominal pain. Pertinent negatives include no chest pain and no shortness of breath. Patient presents with severe right flank abdominal pain.  Began around 2 hours ago.  Nausea and vomiting.  Somewhat difficult to get history due to severe pain.  Has had nausea.  No dysuria.  Has had a prior history of kidney stones.  No diarrhea or constipation.  Severe pain.  No radiation.     No past medical history on file.  There are no problems to display for this patient.   Past Surgical History:  Procedure Laterality Date   CESAREAN SECTION       OB History   No obstetric history on file.     No family history on file.  Social History   Tobacco Use   Smoking status: Never   Smokeless tobacco: Never  Substance Use Topics   Alcohol use: Not Currently   Drug use: Never    Home Medications Prior to Admission medications   Medication Sig Start Date End Date Taking? Authorizing Provider  albuterol (VENTOLIN HFA) 108 (90 Base) MCG/ACT inhaler Inhale 1-2 puffs into the lungs every 6 (six) hours as needed for wheezing or shortness of breath. 03/24/21  Yes Volney American, PA-C  cetirizine (ZYRTEC) 10 MG chewable tablet Chew 10 mg by mouth daily.   Yes [provider]  ibuprofen (ADVIL) 800 MG tablet Take 1 tablet (800 mg total) by mouth every 8 (eight) hours as needed for mild pain. 09/05/18  Yes Ward, Kristen N, DO  ondansetron (ZOFRAN-ODT) 4 MG disintegrating tablet Take 1 tablet (4 mg total) by mouth every 8 (eight) hours as needed for nausea or vomiting. 05/02/21  Yes Davonna Belling, MD  oxyCODONE-acetaminophen (PERCOCET/ROXICET) 5-325 MG tablet Take 1-2 tablets by mouth every 8 (eight) hours as needed for severe  pain. 05/02/21  Yes Davonna Belling, MD  Phenylephrine-Chlorphen-DM (ROBITUSSIN COUGH/ALLERGY PO) Take by mouth.   Yes [provider]  predniSONE (DELTASONE) 20 MG tablet Take 2 tablets (40 mg total) by mouth daily with breakfast. Patient not taking: Reported on 05/02/2021 03/24/21   Volney American, PA-C  tamsulosin (FLOMAX) 0.4 MG CAPS capsule Take 1 capsule (0.4 mg total) by mouth daily. Take until your stone passes. Patient not taking: Reported on 03/24/2021 09/05/18   Ward, Delice Bison, DO    Allergies    Patient has no known allergies.  Review of Systems   Review of Systems  Constitutional:  Positive for appetite change.  Respiratory:  Negative for shortness of breath.   Cardiovascular:  Negative for chest pain.  Gastrointestinal:  Positive for abdominal pain.  Genitourinary:  Positive for flank pain.  Musculoskeletal:  Negative for back pain.  Skin:  Negative for rash.  Neurological:  Negative for weakness.  Psychiatric/Behavioral:  Negative for confusion.    Physical Exam Updated Vital Signs BP 133/79 (BP Location: Right Arm)    Pulse 77    Temp (!) 96.4 F (35.8 C) (Temporal)    Resp 18    Ht 5\' 3"  (1.6 m)    Wt 90.7 kg    SpO2 97%    BMI 35.43 kg/m   Physical Exam Vitals and nursing note reviewed.  Constitutional:  Comments: Appears uncomfortable in bed.  Grabbing right abdomen.  HENT:     Head: Atraumatic.  Eyes:     Pupils: Pupils are equal, round, and reactive to light.  Cardiovascular:     Rate and Rhythm: Regular rhythm.  Pulmonary:     Breath sounds: No wheezing, rhonchi or rales.  Abdominal:     Tenderness: There is abdominal tenderness.     Comments: Right mid abdominal tenderness.  No right upper quadrant or right lower quadrant tenderness.  Genitourinary:    Comments: Some CVA tenderness on the right side. Musculoskeletal:        General: No tenderness.  Skin:    General: Skin is warm.     Capillary Refill: Capillary refill  takes less than 2 seconds.  Neurological:     Mental Status: She is alert and oriented to person, place, and time.    ED Results / Procedures / Treatments   Labs (all labs ordered are listed, but only abnormal results are displayed) Labs Reviewed  COMPREHENSIVE METABOLIC PANEL - Abnormal; Notable for the following components:      Result Value   Potassium 3.4 (*)    Glucose, Bld 133 (*)    All other components within normal limits  URINALYSIS, ROUTINE W REFLEX MICROSCOPIC - Abnormal; Notable for the following components:   Color, Urine STRAW (*)    Glucose, UA 50 (*)    Hgb urine dipstick SMALL (*)    Ketones, ur 20 (*)    Bacteria, UA RARE (*)    All other components within normal limits  CBC WITH DIFFERENTIAL/PLATELET  HCG, QUANTITATIVE, PREGNANCY    EKG None  Radiology CT Renal Stone Study  Result Date: 05/02/2021 CLINICAL DATA:  Right flank pain.  Kidney stones suspected.  Nausea. EXAM: CT ABDOMEN AND PELVIS WITHOUT CONTRAST TECHNIQUE: Multidetector CT imaging of the abdomen and pelvis was performed following the standard protocol without IV contrast. COMPARISON:  09/05/2018 FINDINGS: Lower chest: Mild dependent changes. Hepatobiliary: No focal liver abnormality is seen. No gallstones, gallbladder wall thickening, or biliary dilatation. Pancreas: Unremarkable. No pancreatic ductal dilatation or surrounding inflammatory changes. Spleen: Normal in size without focal abnormality. Adrenals/Urinary Tract: No adrenal gland nodules. 5 mm stone in the distal right ureter at the ureterovesical junction with moderate proximal hydronephrosis and hydroureter. Stranding around the right kidney and right renal pelvis and ureter. Additional tiny 2 mm stones are demonstrated in both kidneys. No hydronephrosis on the left. Bladder is unremarkable. Stomach/Bowel: Stomach, small bowel, and colon are not abnormally distended. No wall thickening or inflammatory changes are appreciated. Appendix is  normal. Vascular/Lymphatic: Minimal aortic calcification. No aneurysm. No significant lymphadenopathy. Reproductive: Uterus and ovaries are not enlarged. Other: No free air or free fluid in the abdomen. Abdominal wall musculature appears intact. Musculoskeletal: No acute or significant osseous findings. IMPRESSION: 1. 5 mm stone in the distal right ureter with moderate proximal obstruction. 2. Additional nonobstructing stones in both kidneys. 3. Minimal aortic atherosclerosis. Electronically Signed   By: Lucienne Capers M.D.   On: 05/02/2021 18:12    Procedures Procedures   Medications Ordered in ED Medications  oxyCODONE-acetaminophen (PERCOCET/ROXICET) 5-325 MG per tablet 1 tablet (has no administration in time range)  HYDROmorphone (DILAUDID) injection 0.5 mg (0.5 mg Intravenous Given 05/02/21 1525)  ondansetron (ZOFRAN) injection 4 mg (4 mg Intravenous Given 05/02/21 1525)  HYDROmorphone (DILAUDID) injection 1 mg (1 mg Intravenous Given 05/02/21 1601)  ketorolac (TORADOL) 30 MG/ML injection 15 mg (15 mg Intravenous Given  05/02/21 1821)    ED Course  I have reviewed the triage vital signs and the nursing notes.  Pertinent labs & imaging results that were available during my care of the patient were reviewed by me and considered in my medical decision making (see chart for details).    MDM Rules/Calculators/A&P                         Patient with cute onset of right-sided abdominal pain.  Did have previous history of kidney stones.  Severe pain.  Improved after 2 doses of Dilaudid and then Toradol.  Feeling better now.  CT scan showed 5 mm stone but it is distal.  Urine does not show infection.  Good kidney function.  Tolerating orals.  Unfortunately does still have bilateral renal stones.  Will discharge home with urology follow-up    Final Clinical Impression(s) / ED Diagnoses Final diagnoses:  Kidney stone    Rx / DC Orders ED Discharge Orders          Ordered    ondansetron  (ZOFRAN-ODT) 4 MG disintegrating tablet  Every 8 hours PRN        05/02/21 2055    oxyCODONE-acetaminophen (PERCOCET/ROXICET) 5-325 MG tablet  Every 8 hours PRN        05/02/21 2055             Davonna Belling, MD 05/02/21 2059

## 2021-05-02 NOTE — ED Triage Notes (Signed)
Patient with right flank and groin pain that started a couple hours prior.

## 2021-05-07 ENCOUNTER — Telehealth (HOSPITAL_COMMUNITY): Payer: Self-pay | Admitting: Emergency Medicine

## 2021-05-07 ENCOUNTER — Encounter (HOSPITAL_COMMUNITY): Payer: Self-pay

## 2021-05-07 ENCOUNTER — Emergency Department (HOSPITAL_COMMUNITY)
Admission: EM | Admit: 2021-05-07 | Discharge: 2021-05-07 | Disposition: A | Discharge status: Home / Self Care | Payer: PRIVATE HEALTH INSURANCE | Attending: Emergency Medicine | Admitting: Emergency Medicine

## 2021-05-07 ENCOUNTER — Other Ambulatory Visit: Payer: Self-pay

## 2021-05-07 DIAGNOSIS — Z79899 Other long term (current) drug therapy: Secondary | ICD-10-CM | POA: Diagnosis not present

## 2021-05-07 DIAGNOSIS — R1031 Right lower quadrant pain: Secondary | ICD-10-CM | POA: Diagnosis present

## 2021-05-07 DIAGNOSIS — N201 Calculus of ureter: Secondary | ICD-10-CM | POA: Diagnosis not present

## 2021-05-07 DIAGNOSIS — N23 Unspecified renal colic: Secondary | ICD-10-CM

## 2021-05-07 LAB — URINALYSIS, ROUTINE W REFLEX MICROSCOPIC
Bilirubin Urine: NEGATIVE
Glucose, UA: NEGATIVE mg/dL
Hgb urine dipstick: NEGATIVE
Ketones, ur: 5 mg/dL — AB
Leukocytes,Ua: NEGATIVE
Nitrite: NEGATIVE
Protein, ur: NEGATIVE mg/dL
Specific Gravity, Urine: 1.024 (ref 1.005–1.030)
pH: 5 (ref 5.0–8.0)

## 2021-05-07 LAB — CBC WITH DIFFERENTIAL/PLATELET
Abs Immature Granulocytes: 0.04 10*3/uL (ref 0.00–0.07)
Basophils Absolute: 0 10*3/uL (ref 0.0–0.1)
Basophils Relative: 0 %
Eosinophils Absolute: 0.1 10*3/uL (ref 0.0–0.5)
Eosinophils Relative: 1 %
HCT: 42.4 % (ref 36.0–46.0)
Hemoglobin: 14.1 g/dL (ref 12.0–15.0)
Immature Granulocytes: 0 %
Lymphocytes Relative: 15 %
Lymphs Abs: 1.6 10*3/uL (ref 0.7–4.0)
MCH: 29.5 pg (ref 26.0–34.0)
MCHC: 33.3 g/dL (ref 30.0–36.0)
MCV: 88.7 fL (ref 80.0–100.0)
Monocytes Absolute: 1 10*3/uL (ref 0.1–1.0)
Monocytes Relative: 9 %
Neutro Abs: 8 10*3/uL — ABNORMAL HIGH (ref 1.7–7.7)
Neutrophils Relative %: 75 %
Platelets: 214 10*3/uL (ref 150–400)
RBC: 4.78 MIL/uL (ref 3.87–5.11)
RDW: 13.3 % (ref 11.5–15.5)
WBC: 10.7 10*3/uL — ABNORMAL HIGH (ref 4.0–10.5)
nRBC: 0 % (ref 0.0–0.2)

## 2021-05-07 LAB — COMPREHENSIVE METABOLIC PANEL
ALT: 33 U/L (ref 0–44)
AST: 23 U/L (ref 15–41)
Albumin: 4.3 g/dL (ref 3.5–5.0)
Alkaline Phosphatase: 61 U/L (ref 38–126)
Anion gap: 7 (ref 5–15)
BUN: 16 mg/dL (ref 6–20)
CO2: 26 mmol/L (ref 22–32)
Calcium: 8.9 mg/dL (ref 8.9–10.3)
Chloride: 102 mmol/L (ref 98–111)
Creatinine, Ser: 1 mg/dL (ref 0.44–1.00)
GFR, Estimated: >60 mL/min (ref >60)
Glucose, Bld: 153 mg/dL — ABNORMAL HIGH (ref 70–99)
Potassium: 3.8 mmol/L (ref 3.5–5.1)
Sodium: 135 mmol/L (ref 135–145)
Total Bilirubin: 0.8 mg/dL (ref 0.3–1.2)
Total Protein: 7.9 g/dL (ref 6.5–8.1)

## 2021-05-07 LAB — LIPASE, BLOOD: Lipase: 28 U/L (ref 11–51)

## 2021-05-07 LAB — PREGNANCY, URINE: Preg Test, Ur: NEGATIVE

## 2021-05-07 MED ORDER — HYDROMORPHONE HCL 1 MG/ML IJ SOLN
1.0000 mg | Freq: Once | INTRAMUSCULAR | Status: AC
  Administered 2021-05-07: 1 mg via INTRAVENOUS
  Filled 2021-05-07: qty 1

## 2021-05-07 MED ORDER — KETOROLAC TROMETHAMINE 10 MG PO TABS: 10.0000 mg | ORAL_TABLET | Freq: Four times a day (QID) | ORAL | 0 refills | Status: DC | PRN

## 2021-05-07 MED ORDER — SODIUM CHLORIDE 0.9 % IV BOLUS
1000.0000 mL | Freq: Once | INTRAVENOUS | Status: AC
  Administered 2021-05-07: 1000 mL via INTRAVENOUS

## 2021-05-07 MED ORDER — TAMSULOSIN HCL 0.4 MG PO CAPS
0.4000 mg | ORAL_CAPSULE | Freq: Once | ORAL | Status: AC
  Administered 2021-05-07: 0.4 mg via ORAL
  Filled 2021-05-07: qty 1

## 2021-05-07 MED ORDER — TAMSULOSIN HCL 0.4 MG PO CAPS: 0.4000 mg | ORAL_CAPSULE | Freq: Every day | ORAL | 0 refills | Status: DC

## 2021-05-07 MED ORDER — OXYCODONE-ACETAMINOPHEN 5-325 MG PO TABS: 1.0000 | ORAL_TABLET | Freq: Four times a day (QID) | ORAL | 0 refills | Status: DC | PRN

## 2021-05-07 MED ORDER — ONDANSETRON HCL 4 MG PO TABS: 4.0000 mg | ORAL_TABLET | Freq: Four times a day (QID) | ORAL | 0 refills | Status: DC

## 2021-05-07 MED ORDER — ONDANSETRON HCL 4 MG/2ML IJ SOLN
4.0000 mg | Freq: Once | INTRAMUSCULAR | Status: AC
  Administered 2021-05-07: 4 mg via INTRAVENOUS
  Filled 2021-05-07: qty 2

## 2021-05-07 MED ORDER — KETOROLAC TROMETHAMINE 15 MG/ML IJ SOLN
15.0000 mg | Freq: Once | INTRAMUSCULAR | Status: AC
  Administered 2021-05-07: 15 mg via INTRAVENOUS
  Filled 2021-05-07: qty 1

## 2021-05-07 NOTE — ED Notes (Signed)
Patient given water for PO challenge.  

## 2021-05-07 NOTE — ED Triage Notes (Signed)
Pt reports right flank pain reoccurring this week. Seen last week at [facility] and dx with 6mm stone with multiple other nonobstructing.

## 2021-05-07 NOTE — ED Provider Notes (Signed)
[facility] DEPT Provider Note   CSN: [id number] Arrival date & time: 05/07/21  0020     History  Chief Complaint  Patient presents with   Flank Pain    Wendy Peck is a 46 y.o. female.  HPI  46 year old female with no pertinent past medical history presents to the emergency department today for evaluation of right flank pain.  Patient was seen in the ED last week for right-sided abdominal pain.  At that time she was diagnosed with a kidney stone.  She was started on pain medications at that time.  A few days after she was discharged the pain moved to her right flank area.  She has had associated nausea but no vomiting.  She is been taking the pain medication which has provided temporary relief however has not resolved her symptoms.  She denies any documented fevers at home.  She has had some mild constipation.  She denies any dysuria frequency urgency or hematuria.  She has tried to make an appoint with [facility] but cannot be seen until February.  Home Medications Prior to Admission medications   Medication Sig Start Date End Date Taking? Authorizing Provider  ketorolac (TORADOL) 10 MG tablet Take 1 tablet (10 mg total) by mouth every 6 (six) hours as needed. 05/07/21  Yes Karis Emig S, PA-C  ondansetron (ZOFRAN) 4 MG tablet Take 1 tablet (4 mg total) by mouth every 6 (six) hours. 05/07/21  Yes Batul Diego S, PA-C  oxyCODONE-acetaminophen (PERCOCET/ROXICET) 5-325 MG tablet Take 1 tablet by mouth every 6 (six) hours as needed for severe pain. 05/07/21  Yes Rossanna Spitzley S, PA-C  tamsulosin (FLOMAX) 0.4 MG CAPS capsule Take 1 capsule (0.4 mg total) by mouth daily. 05/07/21 06/06/21 Yes Dow Blahnik S, PA-C  albuterol (VENTOLIN HFA) 108 (90 Base) MCG/ACT inhaler Inhale 1-2 puffs into the lungs every 6 (six) hours as needed for wheezing or shortness of breath. 03/24/21   Volney American, PA-C  cetirizine (ZYRTEC) 10 MG chewable tablet Chew  10 mg by mouth daily.    [provider]  ibuprofen (ADVIL) 800 MG tablet Take 1 tablet (800 mg total) by mouth every 8 (eight) hours as needed for mild pain. 09/05/18   Ward, Delice Bison, DO  ondansetron (ZOFRAN-ODT) 4 MG disintegrating tablet Take 1 tablet (4 mg total) by mouth every 8 (eight) hours as needed for nausea or vomiting. 05/02/21   Davonna Belling, MD  oxyCODONE-acetaminophen (PERCOCET/ROXICET) 5-325 MG tablet Take 1-2 tablets by mouth every 8 (eight) hours as needed for severe pain. 05/02/21   Davonna Belling, MD  Phenylephrine-Chlorphen-DM (ROBITUSSIN COUGH/ALLERGY PO) Take by mouth.    [provider]  predniSONE (DELTASONE) 20 MG tablet Take 2 tablets (40 mg total) by mouth daily with breakfast. Patient not taking: Reported on 05/02/2021 03/24/21   Volney American, PA-C      Allergies    Patient has no known allergies.    Review of Systems   Review of Systems  Constitutional:  Negative for fever.  HENT:  Negative for ear pain and sore throat.   Eyes:  Negative for visual disturbance.  Respiratory:  Negative for cough and shortness of breath.   Cardiovascular:  Negative for chest pain.  Gastrointestinal:  Positive for constipation and nausea. Negative for abdominal pain, diarrhea and vomiting.  Genitourinary:  Positive for flank pain. Negative for dysuria and hematuria.  Musculoskeletal:  Negative for back pain.  Skin:  Negative for rash.  Neurological:  Negative for headaches.  All other systems reviewed and are negative.  Physical Exam Updated Vital Signs BP 120/66    Pulse 71    Temp 98.3 F (36.8 C) (Oral)    Resp 16    Ht 5\' 3"  (1.6 m)    SpO2 96%    BMI 35.43 kg/m  Physical Exam Vitals and nursing note reviewed.  Constitutional:      General: She is not in acute distress.    Appearance: She is well-developed.  HENT:     Head: Normocephalic and atraumatic.  Eyes:     Conjunctiva/sclera: Conjunctivae normal.  Cardiovascular:      Rate and Rhythm: Normal rate and regular rhythm.     Heart sounds: Normal heart sounds. No murmur heard. Pulmonary:     Effort: Pulmonary effort is normal. No respiratory distress.     Breath sounds: Normal breath sounds.  Abdominal:     Palpations: Abdomen is soft.     Tenderness: There is no abdominal tenderness. There is right CVA tenderness. There is no left CVA tenderness, guarding or rebound.  Musculoskeletal:        General: No swelling.     Cervical back: Neck supple.  Skin:    General: Skin is warm and dry.     Capillary Refill: Capillary refill takes less than 2 seconds.  Neurological:     Mental Status: She is alert.  Psychiatric:        Mood and Affect: Mood normal.    ED Results / Procedures / Treatments   Labs (all labs ordered are listed, but only abnormal results are displayed) Labs Reviewed  CBC WITH DIFFERENTIAL/PLATELET - Abnormal; Notable for the following components:      Result Value   WBC 10.7 (*)    Neutro Abs 8.0 (*)    All other components within normal limits  COMPREHENSIVE METABOLIC PANEL - Abnormal; Notable for the following components:   Glucose, Bld 153 (*)    All other components within normal limits  URINALYSIS, ROUTINE W REFLEX MICROSCOPIC - Abnormal; Notable for the following components:   Ketones, ur 5 (*)    All other components within normal limits  LIPASE, BLOOD  PREGNANCY, URINE    EKG None  Radiology No results found.  Procedures Procedures     Medications Ordered in ED Medications  ketorolac (TORADOL) 15 MG/ML injection 15 mg (15 mg Intravenous Given 05/07/21 0331)  HYDROmorphone (DILAUDID) injection 1 mg (1 mg Intravenous Given 05/07/21 0332)  ondansetron (ZOFRAN) injection 4 mg (4 mg Intravenous Given 05/07/21 0330)  sodium chloride 0.9 % bolus 1,000 mL (0 mLs Intravenous Stopped 05/07/21 0424)  tamsulosin (FLOMAX) capsule 0.4 mg (0.4 mg Oral Given 05/07/21 0424)    ED Course/ Medical Decision Making/ A&P                            Medical Decision Making  46 year old female presents to the emergency department today for evaluation of right flank pain.  She was previously diagnosed with a kidney stone 1 week ago but pain is persisted.  She has not had any documented fevers at home, denies urinary symptoms and has had no persistent vomiting.  Reviewed/interpreted labs CBC with mild leukocytosis, no anemia CMP with normal renal function, normal LFTs Lipase negative Urine preg neg  Reviewed/interpreted prior imaging.  CT 12/27 showed 16mm stone.  Do not feel repeat imaging is necessary at this time.  Patient nontoxic-appearing, normal vital signs.  Labs are reassuring.  She was given Toradol, Dilaudid, Zofran and IV fluids.  On reassessment she states she is feeling significant improvement of her symptoms.  She has been able to tolerate p.o. here in the ED.  We will continue with supportive care.  Additional pain meds given including Percocet, Toradol, tamsulosin and Zofran.  Advised on plan for follow-up with urology and strict return precautions.  She voices understanding of plan and reasons to return.  All questions answered.  Patient stable for discharge.   Final Clinical Impression(s) / ED Diagnoses Final diagnoses:  Ureteral colic    Rx / DC Orders ED Discharge Orders          Ordered    tamsulosin (FLOMAX) 0.4 MG CAPS capsule  Daily        05/07/21 0450    ketorolac (TORADOL) 10 MG tablet  Every 6 hours PRN        05/07/21 0450    oxyCODONE-acetaminophen (PERCOCET/ROXICET) 5-325 MG tablet  Every 6 hours PRN        05/07/21 0450    ondansetron (ZOFRAN) 4 MG tablet  Every 6 hours        05/07/21 0451              Yomira Flitton, [employer], PA-C 05/07/21 0452    Ripley Fraise, MD 05/07/21 [phone]

## 2021-05-07 NOTE — Discharge Instructions (Addendum)
Take tamsulosin, percocet, zofran and toradol as directed   Do not take ibuprofen or other NSAIDs with toradol   Please follow up [facility] within the next 1-2 weeks.   Please return to the emergency department for any new or worsening symptoms.

## 2021-05-16 ENCOUNTER — Encounter: Payer: Self-pay | Admitting: Urology

## 2021-05-16 ENCOUNTER — Other Ambulatory Visit: Payer: Self-pay

## 2021-05-16 ENCOUNTER — Ambulatory Visit (INDEPENDENT_AMBULATORY_CARE_PROVIDER_SITE_OTHER): Payer: PRIVATE HEALTH INSURANCE | Admitting: Urology

## 2021-05-16 VITALS — BP 138/86 | HR 94 | Temp 99.1°F | Ht 63.0 in | Wt 200.6 lb

## 2021-05-16 DIAGNOSIS — N201 Calculus of ureter: Secondary | ICD-10-CM | POA: Diagnosis not present

## 2021-05-16 DIAGNOSIS — N200 Calculus of kidney: Secondary | ICD-10-CM | POA: Diagnosis not present

## 2021-05-16 LAB — URINALYSIS, ROUTINE W REFLEX MICROSCOPIC
Bilirubin, UA: NEGATIVE
Glucose, UA: NEGATIVE
Leukocytes,UA: NEGATIVE
Nitrite, UA: NEGATIVE
Protein,UA: NEGATIVE
RBC, UA: NEGATIVE
Specific Gravity, UA: 1.02 (ref 1.005–1.030)
Urobilinogen, Ur: 0.2 mg/dL (ref 0.2–1.0)
pH, UA: 6 (ref 5.0–7.5)

## 2021-05-16 NOTE — Progress Notes (Signed)
Present   Assessment: 1. Ureteral calculus, right   2. Nephrolithiasis     Plan: I personally reviewed the CT study from 05/02/2021 showing a distal right ureteral calculus and bilateral nephrolithiasis.  Results discussed with the patient.  Since she has passed the stone spontaneously and is currently asymptomatic, no intervention indicated at this time. Stone for [employer]. Stone prevention discussed and information provided. Return to office as needed.  Chief Complaint:  Chief Complaint  Patient presents with   Nephrolithiasis    History of Present Illness:  Wendy Peck is a 46 y.o. year old female who is seen in consultation from Dian Queen, MD for evaluation of right ureteral calculus.  She had onset of right-sided flank pain in late December 2022.  To the emergency room where a CT study showed a 5 mm stone in the right distal ureter near the UVJ with moderate right hydronephrosis and additional 2 mm renal stones bilaterally.  She passed the stone spontaneously several days later.  She continued to have some flank pain until 05/11/2021.  Since that time, she has had no flank pain and no lower urinary tract symptoms.  No dysuria or gross hematuria.  No fevers or chills.  She has 1 prior episode of renal colic 2 years ago.   Past Medical History:  History reviewed. No pertinent past medical history.  Past Surgical History:  Past Surgical History:  Procedure Laterality Date   CESAREAN SECTION      Allergies:  No Known Allergies  Family History:  History reviewed. No pertinent family history.  Social History:  Social History   Tobacco Use   Smoking status: Never   Smokeless tobacco: Never  Substance Use Topics   Alcohol use: Not Currently   Drug use: Never    Review of symptoms:  Constitutional:  Negative for unexplained weight loss, night sweats, fever, chills ENT:  Negative for nose bleeds, sinus pain, painful swallowing CV:  Negative for chest pain,  shortness of breath, exercise intolerance, palpitations, loss of consciousness Resp:  Negative for cough, wheezing, shortness of breath GI:  Negative for nausea, vomiting, diarrhea, bloody stools GU:  Positives noted in HPI; otherwise negative for gross hematuria, dysuria Neuro:  Negative for seizures, poor balance, limb weakness, slurred speech Psych:  Negative for lack of energy, depression, anxiety Endocrine:  Negative for polydipsia, polyuria, symptoms of hypoglycemia (dizziness, hunger, sweating) Hematologic:  Negative for anemia, purpura, petechia, prolonged or excessive bleeding, use of anticoagulants  Allergic:  Negative for difficulty breathing or choking as a result of exposure to anything; no shellfish allergy; no allergic response (rash/itch) to materials, foods  Physical exam: BP 138/86    Pulse 94    Temp 99.1 F (37.3 C)    Ht 5\' 3"  (1.6 m)    Wt 200 lb 9.6 oz (91 kg)    BMI 35.53 kg/m  GENERAL APPEARANCE:  Well appearing, well developed, well nourished, NAD HEENT: Atraumatic, Normocephalic, oropharynx clear. NECK: Supple without lymphadenopathy or thyromegaly. LUNGS: Clear to auscultation bilaterally. HEART: Regular Rate and Rhythm without murmurs, gallops, or rubs. ABDOMEN: Soft, non-tender, No Masses. EXTREMITIES: Moves all extremities well.  Without clubbing, cyanosis, or edema. NEUROLOGIC:  Alert and oriented x 3, normal gait, CN II-XII grossly intact.  MENTAL STATUS:  Appropriate. BACK:  Non-tender to palpation.  No CVAT SKIN:  Warm, dry and intact.    Results: U/A dipstick negative

## 2021-05-16 NOTE — Progress Notes (Signed)
Urological Symptom Review  Patient is experiencing the following symptoms: Leakage of urine   Review of Systems  Gastrointestinal (upper)  : Negative for upper GI symptoms  Gastrointestinal (lower) : Negative for lower GI symptoms  Constitutional : Negative for symptoms  Skin: Negative for skin symptoms  Eyes: Negative for eye symptoms  Ear/Nose/Throat : Negative for Ear/Nose/Throat symptoms  Hematologic/Lymphatic: Negative for Hematologic/Lymphatic symptoms  Cardiovascular : Negative for cardiovascular symptoms  Respiratory : Negative for respiratory symptoms  Endocrine: Negative for endocrine symptoms  Musculoskeletal: Negative for musculoskeletal symptoms  Neurological: Headaches  Psychologic: Negative for psychiatric symptoms

## 2021-05-19 LAB — CALCULI, WITH PHOTOGRAPH (CLINICAL LAB)
Calcium Oxalate Dihydrate: 20 %
Calcium Oxalate Monohydrate: 80 %
Weight Calculi: 3 mg

## 2021-06-07 ENCOUNTER — Ambulatory Visit: Payer: PRIVATE HEALTH INSURANCE | Admitting: Urology

## 2022-04-11 ENCOUNTER — Encounter: Payer: Self-pay | Admitting: Internal Medicine

## 2022-04-11 DIAGNOSIS — G43909 Migraine, unspecified, not intractable, without status migrainosus: Secondary | ICD-10-CM | POA: Insufficient documentation

## 2022-04-12 ENCOUNTER — Encounter (INDEPENDENT_AMBULATORY_CARE_PROVIDER_SITE_OTHER): Payer: Self-pay | Admitting: *Deleted

## 2022-05-02 ENCOUNTER — Telehealth: Payer: Self-pay | Admitting: *Deleted

## 2022-05-02 DIAGNOSIS — Z1211 Encounter for screening for malignant neoplasm of colon: Secondary | ICD-10-CM

## 2022-05-02 NOTE — Telephone Encounter (Signed)
Referring MD/PCP: Maebelle Munroe  Procedure: Colonoscopy  Reason/Indication:  screening   Has patient had this procedure before?  no  If so, when, by whom and where?    Is there a family history of colon cancer?  no  Who?  What age when diagnosed?    Is patient diabetic? If yes, Type 1 or Type 2   no      Does patient have prosthetic heart valve or mechanical valve?  no  Do you have a pacemaker/defibrillator?  no  Has patient ever had endocarditis/atrial fibrillation? no  Does patient use oxygen? no  Has patient had joint replacement within last 12 months?  no  Is patient constipated or do they take laxatives? no  Does patient have a history of alcohol/drug use?  no  Have you had a stroke/heart attack last 6 mths? no  Do you take medicine for weight loss?  no  For female patients,: have you had a hysterectomy no                      are you post menopausal no                      do you still have your menstrual cycle yes  Is patient on blood thinner such as Coumadin, Plavix and/or Aspirin? no  Medications:  Current Outpatient Medications on File Prior to Visit  Medication Sig Dispense Refill   ibuprofen (ADVIL) 200 MG tablet Take 200 mg by mouth every 6 (six) hours as needed.     No current facility-administered medications on file prior to visit.     Allergies: No Known Allergies   [facility]

## 2022-05-02 NOTE — Telephone Encounter (Signed)
Any room Thanks 

## 2022-05-03 NOTE — Telephone Encounter (Signed)
LMOVM

## 2022-05-10 NOTE — Telephone Encounter (Signed)
LMTCB. Letter mailed.  

## 2022-05-30 MED ORDER — PEG 3350-KCL-NA BICARB-NACL 420 G PO SOLR: 4000.0000 mL | Freq: Once | ORAL | 0 refills | Status: AC

## 2022-05-30 NOTE — Telephone Encounter (Signed)
Pt called in. She has been scheduled for 2/21 at 9:15am. Aware will need urine preg prior and will have done at [facility] Will send instructions. Rx for prep sent to [facility]. As of 2/1 she will have new insurance. She will call or send mychart information to have this added to her chart.

## 2022-05-30 NOTE — Addendum Note (Signed)
Addended by: Cheron Every on: 05/30/2022 04:22 PM   Modules accepted: Orders

## 2022-05-31 ENCOUNTER — Encounter (INDEPENDENT_AMBULATORY_CARE_PROVIDER_SITE_OTHER): Payer: Self-pay | Admitting: *Deleted

## 2022-05-31 NOTE — Telephone Encounter (Signed)
Referral completed

## 2022-06-07 ENCOUNTER — Telehealth: Payer: Self-pay | Admitting: *Deleted

## 2022-06-07 NOTE — Telephone Encounter (Signed)
Pt left vm stating that she added updated insurance.  Wendy Peck

## 2022-06-25 ENCOUNTER — Other Ambulatory Visit (HOSPITAL_COMMUNITY)
Admission: RE | Admit: 2022-06-25 | Discharge: 2022-06-25 | Disposition: A | Discharge status: Home / Self Care | Payer: BC Managed Care – PPO | Source: Ambulatory Visit | Attending: Gastroenterology | Admitting: Gastroenterology

## 2022-06-25 DIAGNOSIS — Z1211 Encounter for screening for malignant neoplasm of colon: Secondary | ICD-10-CM | POA: Insufficient documentation

## 2022-06-25 DIAGNOSIS — Z87442 Personal history of urinary calculi: Secondary | ICD-10-CM | POA: Diagnosis not present

## 2022-06-25 DIAGNOSIS — D122 Benign neoplasm of ascending colon: Secondary | ICD-10-CM | POA: Diagnosis not present

## 2022-06-25 LAB — PREGNANCY, URINE: Preg Test, Ur: NEGATIVE

## 2022-06-27 ENCOUNTER — Encounter (HOSPITAL_COMMUNITY)
Admission: RE | Disposition: A | Discharge status: Home / Self Care | Payer: Self-pay | Source: Home / Self Care | Attending: Gastroenterology

## 2022-06-27 ENCOUNTER — Other Ambulatory Visit: Payer: Self-pay

## 2022-06-27 ENCOUNTER — Ambulatory Visit (HOSPITAL_COMMUNITY): Payer: BC Managed Care – PPO | Admitting: Anesthesiology

## 2022-06-27 ENCOUNTER — Encounter (HOSPITAL_COMMUNITY): Payer: Self-pay | Admitting: Gastroenterology

## 2022-06-27 ENCOUNTER — Ambulatory Visit (HOSPITAL_COMMUNITY)
Admission: RE | Admit: 2022-06-27 | Discharge: 2022-06-27 | Disposition: A | Discharge status: Home / Self Care | Payer: BC Managed Care – PPO | Attending: Gastroenterology | Admitting: Gastroenterology

## 2022-06-27 DIAGNOSIS — K635 Polyp of colon: Secondary | ICD-10-CM | POA: Diagnosis not present

## 2022-06-27 DIAGNOSIS — D122 Benign neoplasm of ascending colon: Secondary | ICD-10-CM | POA: Diagnosis not present

## 2022-06-27 DIAGNOSIS — Z1211 Encounter for screening for malignant neoplasm of colon: Secondary | ICD-10-CM | POA: Diagnosis not present

## 2022-06-27 DIAGNOSIS — Z87442 Personal history of urinary calculi: Secondary | ICD-10-CM | POA: Insufficient documentation

## 2022-06-27 HISTORY — PX: POLYPECTOMY: SHX5525

## 2022-06-27 HISTORY — PX: SCLEROTHERAPY: SHX6841

## 2022-06-27 HISTORY — PX: COLONOSCOPY WITH PROPOFOL: SHX5780

## 2022-06-27 LAB — HM COLONOSCOPY

## 2022-06-27 SURGERY — POLYPECTOMY
Anesthesia: General

## 2022-06-27 MED ORDER — PROPOFOL 500 MG/50ML IV EMUL
INTRAVENOUS | Status: DC | PRN
  Administered 2022-06-27: 150 ug/kg/min via INTRAVENOUS

## 2022-06-27 MED ORDER — PROPOFOL 500 MG/50ML IV EMUL
INTRAVENOUS | Status: AC
  Filled 2022-06-27: qty 200

## 2022-06-27 MED ORDER — LIDOCAINE HCL (CARDIAC) PF 100 MG/5ML IV SOSY
PREFILLED_SYRINGE | INTRAVENOUS | Status: DC | PRN
  Administered 2022-06-27: 50 mg via INTRAVENOUS

## 2022-06-27 MED ORDER — LACTATED RINGERS IV SOLN: INTRAVENOUS | Status: DC

## 2022-06-27 MED ORDER — PROPOFOL 10 MG/ML IV BOLUS
INTRAVENOUS | Status: DC | PRN
  Administered 2022-06-27: 100 mg via INTRAVENOUS

## 2022-06-27 NOTE — Anesthesia Preprocedure Evaluation (Addendum)
Anesthesia Evaluation  Patient identified by MRN, date of birth, ID band Patient awake    Reviewed: Allergy & Precautions, H&P , NPO status , Patient's Chart, lab work & pertinent test results  Airway Mallampati: II  TM Distance: >3 FB Neck ROM: Full    Dental  (+) Dental Advisory Given, Teeth Intact   Pulmonary neg pulmonary ROS   Pulmonary exam normal breath sounds clear to auscultation       Cardiovascular negative cardio ROS Normal cardiovascular exam Rhythm:Regular Rate:Normal     Neuro/Psych  Headaches  negative psych ROS   GI/Hepatic negative GI ROS, Neg liver ROS,,,  Endo/Other  negative endocrine ROS    Renal/GU Renal disease  negative genitourinary   Musculoskeletal negative musculoskeletal ROS (+)    Abdominal   Peds negative pediatric ROS (+)  Hematology negative hematology ROS (+)   Anesthesia Other Findings   Reproductive/Obstetrics negative OB ROS                             Anesthesia Physical Anesthesia Plan  ASA: 2  Anesthesia Plan: General   Post-op Pain Management: Minimal or no pain anticipated   Induction: Intravenous  PONV Risk Score and Plan: 1 and Propofol infusion  Airway Management Planned: Nasal Cannula and Natural Airway  Additional Equipment:   Intra-op Plan:   Post-operative Plan:   Informed Consent: I have reviewed the patients History and Physical, chart, labs and discussed the procedure including the risks, benefits and alternatives for the proposed anesthesia with the patient or authorized representative who has indicated his/her understanding and acceptance.     Dental advisory given  Plan Discussed with: CRNA and Surgeon  Anesthesia Plan Comments:        Anesthesia Quick Evaluation

## 2022-06-27 NOTE — Discharge Instructions (Signed)
You are being discharged to home.  Resume your previous diet.  We are waiting for your pathology results.  Your physician has recommended a repeat colonoscopy for surveillance based on pathology results.  

## 2022-06-27 NOTE — Op Note (Signed)
[facility] Patient Name: Wendy Peck Procedure Date: 06/27/2022 9:23 AM MRN: [id number] Date of Birth: [birth year] Attending MD: Maylon Peppers , , [id number] CSN: [id number] Age: 47 Admit Type: Outpatient Procedure:                Colonoscopy Indications:              Screening for colorectal malignant neoplasm Providers:                Maylon Peppers, Rosina Lowenstein, RN, Aram Candela Referring MD:             Maylon Peppers Medicines:                Monitored Anesthesia Care Complications:            No immediate complications. Estimated Blood Loss:     Estimated blood loss: none. Procedure:                Pre-Anesthesia Assessment:                           - Prior to the procedure, a History and Physical                            was performed, and patient medications, allergies                            and sensitivities were reviewed. The patient's                            tolerance of previous anesthesia was reviewed.                           - The risks and benefits of the procedure and the                            sedation options and risks were discussed with the                            patient. All questions were answered and informed                            consent was obtained.                           - ASA Grade Assessment: I - A normal, healthy                            patient.                           After obtaining informed consent, the colonoscope                            was passed under direct vision. Throughout the                            procedure, the patient's blood pressure,  pulse, and                            oxygen saturations were monitored continuously. The                            [id number]) was introduced through the                            anus and advanced to the the cecum, identified by                            appendiceal orifice and ileocecal valve. The                            colonoscopy was  performed without difficulty. The                            patient tolerated the procedure well. The quality                            of the bowel preparation was good. Scope In: 9:34:30 AM Scope Out: 10:00:45 AM Scope Withdrawal Time: 0 hours 19 minutes 11 seconds  Total Procedure Duration: 0 hours 26 minutes 15 seconds  Findings:      The perianal and digital rectal examinations were normal.      Three sessile polyps were found in the ascending colon. The polyps were       3 to 8 mm in size. These polyps were removed with a cold snare.       Resection and retrieval were complete.      A 12 mm polyp was found in the ascending colon. The polyp was flat. Area       was successfully injected with 2 mL Eleview for a lift polypectomy.       Imaging was performed using white light and narrow band imaging to       visualize the mucosa and demarcate the polyp site after injection for       EMR purposes. The polyp was removed with a hot snare. Resection and       retrieval were complete.      The retroflexed view of the distal rectum and anal verge was normal and       showed no anal or rectal abnormalities. Impression:               - Three 3 to 8 mm polyps in the ascending colon,                            removed with a cold snare. Resected and retrieved.                           - One 12 mm polyp in the ascending colon, removed                            with a hot snare. Resected and retrieved. Injected.                           -  The distal rectum and anal verge are normal on                            retroflexion view. Moderate Sedation:      Per Anesthesia Care Recommendation:           - Discharge patient to home (ambulatory).                           - Resume previous diet.                           - Await pathology results.                           - Repeat colonoscopy for surveillance based on                            pathology results. Procedure Code(s):        ---  Professional ---                           [id number], 59, Colonoscopy, flexible; with removal of                            tumor(s), polyp(s), or other lesion(s) by snare                            technique                           45381, Colonoscopy, flexible; with directed                            submucosal injection(s), any substance Diagnosis Code(s):        --- Professional ---                           D12.2, Benign neoplasm of ascending colon                           Z12.11, Encounter for screening for malignant                            neoplasm of colon CPT copyright 2022 [employer]. All rights reserved. The codes documented in this report are preliminary and upon coder review may  be revised to meet current compliance requirements. Maylon Peppers, MD Maylon Peppers,  06/27/2022 10:06:26 AM This report has been signed electronically. Number of Addenda: 0

## 2022-06-27 NOTE — Anesthesia Postprocedure Evaluation (Signed)
Anesthesia Post Note  Patient: Wendy Peck  Procedure(s) Performed: COLONOSCOPY WITH PROPOFOL POLYPECTOMY SCLEROTHERAPY  Patient location during evaluation: Phase II Anesthesia Type: General Level of consciousness: awake and alert and oriented Pain management: pain level controlled Vital Signs Assessment: post-procedure vital signs reviewed and stable Respiratory status: spontaneous breathing, nonlabored ventilation and respiratory function stable Cardiovascular status: blood pressure returned to baseline and stable Postop Assessment: no apparent nausea or vomiting Anesthetic complications: no  No notable events documented.   Last Vitals:  Vitals:   06/27/22 0805 06/27/22 1004  BP: 114/75 104/64  Pulse: 81 69  Resp: 14 16  Temp: 36.7 C 36.5 C  SpO2: 95% 100%    Last Pain:  Vitals:   06/27/22 1007  TempSrc:   PainSc: 0-No pain                 [name] [name]

## 2022-06-27 NOTE — Transfer of Care (Signed)
Immediate Anesthesia Transfer of Care Note  Patient: Wendy Peck  Procedure(s) Performed: COLONOSCOPY WITH PROPOFOL POLYPECTOMY SCLEROTHERAPY  Patient Location: Endoscopy Unit  Anesthesia Type:General  Level of Consciousness: awake  Airway & Oxygen Therapy: Patient Spontanous Breathing  Post-op Assessment: Report given to RN and Post -op Vital signs reviewed and stable  Post vital signs: Reviewed and stable  Last Vitals:  Vitals Value Taken Time  BP 104/64 06/27/22 1004  Temp 36.5 C 06/27/22 1004  Pulse 69 06/27/22 1004  Resp 16 06/27/22 1004  SpO2 100 % 06/27/22 1004    Last Pain:  Vitals:   06/27/22 1004  TempSrc: Oral  PainSc: 0-No pain      Patients Stated Pain Goal: 6 ([id number])  Complications: No notable events documented.

## 2022-06-27 NOTE — H&P (Signed)
Wendy Peck is an 47 y.o. female.   Chief Complaint: CRC screening HPI: 47 year old female with past medical history of kidney stones, coming for screening colonoscopy. The patient has never had a colonoscopy in the past.  The patient denies having any complaints such as melena, hematochezia, abdominal pain or distention, change in her bowel movement consistency or frequency, no changes in weight recently.  No family history of colorectal cancer.   Past Medical History:  Diagnosis Date   Kidney stone     Past Surgical History:  Procedure Laterality Date   CESAREAN SECTION     WISDOM TOOTH EXTRACTION      History reviewed. No pertinent family history. Social History:  reports that she has never smoked. She has never used smokeless tobacco. She reports that she does not currently use alcohol. She reports that she does not use drugs.  Allergies: No Known Allergies  Medications Prior to Admission  Medication Sig Dispense Refill   ibuprofen (ADVIL) 200 MG tablet Take 800 mg by mouth every 6 (six) hours as needed for moderate pain.      Results for orders placed or performed during the hospital encounter of 06/25/22 (from the past 48 hour(s))  Pregnancy, urine     Status: None   Collection Time: 06/25/22 12:02 PM  Result Value Ref Range   Preg Test, Ur NEGATIVE NEGATIVE    Comment:        THE SENSITIVITY OF THIS METHODOLOGY IS >20 mIU/mL. Performed at [facility], [address] [place], [place] [address]    No results found.  Review of Systems  All other systems reviewed and are negative.   Blood pressure 114/75, pulse 81, temperature 98.1 F (36.7 C), temperature source Oral, resp. rate 14, height 5' 3"$  (1.6 m), weight 87.1 kg, SpO2 95 %. Physical Exam  GENERAL: The patient is AO x3, in no acute distress. HEENT: Head is normocephalic and atraumatic. EOMI are intact. Mouth is well hydrated and without lesions. NECK: Supple. No masses LUNGS: Clear to auscultation. No  presence of rhonchi/wheezing/rales. Adequate chest expansion HEART: RRR, normal s1 and s2. ABDOMEN: Soft, nontender, no guarding, no peritoneal signs, and nondistended. BS +. No masses. EXTREMITIES: Without any cyanosis, clubbing, rash, lesions or edema. NEUROLOGIC: AOx3, no focal motor deficit. SKIN: no jaundice, no rashes  Assessment/Plan 47 year old female with past medical history of kidney stones, coming for screening colonoscopy. The patient is at average risk for colorectal cancer.  We will proceed with colonoscopy today.   Harvel Quale, MD 06/27/2022, 8:45 AM

## 2022-06-28 ENCOUNTER — Encounter (INDEPENDENT_AMBULATORY_CARE_PROVIDER_SITE_OTHER): Payer: Self-pay | Admitting: *Deleted

## 2022-06-28 LAB — SURGICAL PATHOLOGY

## 2022-07-04 ENCOUNTER — Encounter (HOSPITAL_COMMUNITY): Payer: Self-pay | Admitting: Gastroenterology

## 2022-08-29 DIAGNOSIS — Z6833 Body mass index (BMI) 33.0-33.9, adult: Secondary | ICD-10-CM | POA: Diagnosis not present

## 2022-08-29 DIAGNOSIS — Z124 Encounter for screening for malignant neoplasm of cervix: Secondary | ICD-10-CM | POA: Diagnosis not present

## 2022-08-29 DIAGNOSIS — Z1231 Encounter for screening mammogram for malignant neoplasm of breast: Secondary | ICD-10-CM | POA: Diagnosis not present

## 2022-08-29 DIAGNOSIS — Z01419 Encounter for gynecological examination (general) (routine) without abnormal findings: Secondary | ICD-10-CM | POA: Diagnosis not present

## 2022-09-25 DIAGNOSIS — N924 Excessive bleeding in the premenopausal period: Secondary | ICD-10-CM | POA: Diagnosis not present

## 2022-09-30 DIAGNOSIS — E669 Obesity, unspecified: Secondary | ICD-10-CM | POA: Diagnosis not present

## 2022-09-30 DIAGNOSIS — R300 Dysuria: Secondary | ICD-10-CM | POA: Diagnosis not present

## 2022-09-30 DIAGNOSIS — Z6833 Body mass index (BMI) 33.0-33.9, adult: Secondary | ICD-10-CM | POA: Diagnosis not present

## 2022-10-23 DIAGNOSIS — Z8262 Family history of osteoporosis: Secondary | ICD-10-CM | POA: Diagnosis not present

## 2022-10-23 DIAGNOSIS — O24419 Gestational diabetes mellitus in pregnancy, unspecified control: Secondary | ICD-10-CM | POA: Diagnosis not present

## 2022-10-23 DIAGNOSIS — E663 Overweight: Secondary | ICD-10-CM | POA: Diagnosis not present

## 2022-10-23 DIAGNOSIS — R002 Palpitations: Secondary | ICD-10-CM | POA: Diagnosis not present

## 2022-10-23 DIAGNOSIS — R7989 Other specified abnormal findings of blood chemistry: Secondary | ICD-10-CM | POA: Diagnosis not present

## 2022-10-30 DIAGNOSIS — Z0000 Encounter for general adult medical examination without abnormal findings: Secondary | ICD-10-CM | POA: Diagnosis not present

## 2022-10-30 DIAGNOSIS — E559 Vitamin D deficiency, unspecified: Secondary | ICD-10-CM | POA: Diagnosis not present

## 2022-10-30 DIAGNOSIS — Z23 Encounter for immunization: Secondary | ICD-10-CM | POA: Diagnosis not present

## 2022-10-30 DIAGNOSIS — Z1331 Encounter for screening for depression: Secondary | ICD-10-CM | POA: Diagnosis not present

## 2022-12-31 DIAGNOSIS — Z30430 Encounter for insertion of intrauterine contraceptive device: Secondary | ICD-10-CM | POA: Diagnosis not present

## 2023-02-12 DIAGNOSIS — Z30431 Encounter for routine checking of intrauterine contraceptive device: Secondary | ICD-10-CM | POA: Diagnosis not present

## 2023-03-01 DIAGNOSIS — B029 Zoster without complications: Secondary | ICD-10-CM | POA: Diagnosis not present

## 2023-04-17 DIAGNOSIS — D259 Leiomyoma of uterus, unspecified: Secondary | ICD-10-CM | POA: Diagnosis not present

## 2023-04-17 DIAGNOSIS — N939 Abnormal uterine and vaginal bleeding, unspecified: Secondary | ICD-10-CM | POA: Diagnosis not present

## 2023-04-17 DIAGNOSIS — Z30431 Encounter for routine checking of intrauterine contraceptive device: Secondary | ICD-10-CM | POA: Diagnosis not present

## 2023-07-16 DIAGNOSIS — N3000 Acute cystitis without hematuria: Secondary | ICD-10-CM | POA: Diagnosis not present

## 2023-07-16 DIAGNOSIS — R350 Frequency of micturition: Secondary | ICD-10-CM | POA: Diagnosis not present

## 2023-07-30 ENCOUNTER — Ambulatory Visit
Admission: EM | Admit: 2023-07-30 | Discharge: 2023-07-30 | Disposition: A | Discharge status: Home / Self Care | Attending: Nurse Practitioner | Admitting: Nurse Practitioner

## 2023-07-30 DIAGNOSIS — R399 Unspecified symptoms and signs involving the genitourinary system: Secondary | ICD-10-CM

## 2023-07-30 LAB — POCT URINALYSIS DIP (MANUAL ENTRY)
Bilirubin, UA: NEGATIVE
Blood, UA: NEGATIVE
Glucose, UA: NEGATIVE mg/dL
Ketones, POC UA: NEGATIVE mg/dL
Leukocytes, UA: NEGATIVE
Nitrite, UA: NEGATIVE
Protein Ur, POC: NEGATIVE mg/dL
Spec Grav, UA: 1.015 (ref 1.010–1.025)
Urobilinogen, UA: 0.2 U/dL
pH, UA: 5.5 (ref 5.0–8.0)

## 2023-07-30 NOTE — ED Triage Notes (Signed)
 Pt reports she has low back pain, low abdominal pain, bladder pressure, and cloudy urine x 1 day

## 2023-07-30 NOTE — ED Provider Notes (Signed)
 [facility]    CSN: [id number] Arrival date & time: 07/30/23  1153      History   Chief Complaint No chief complaint on file.   HPI Wendy Peck is a 48 y.o. female.   The history is provided by the patient.   Patient presents with a 1 day history of low back pain, lower abdominal pain, bladder pressure, and cloudy urine.  Patient denies fever, chills, dysuria, urinary frequency, urgency, hesitancy, hematuria, decreased urine stream, flank pain, or vaginal symptoms.  Patient reports that she was recently treated for UTI over the past 2 weeks.  Prior history of kidney stones.  Past Medical History:  Diagnosis Date   Kidney stone     Patient Active Problem List   Diagnosis Date Noted   Encounter for screening colonoscopy 06/27/2022   Migraine 04/11/2022   Nephrolithiasis 05/16/2021    Past Surgical History:  Procedure Laterality Date   CESAREAN SECTION     COLONOSCOPY WITH PROPOFOL N/A 06/27/2022   Procedure: COLONOSCOPY WITH PROPOFOL;  Surgeon: Dolores Frame, MD;  Location: [facility];  Service: Gastroenterology;  Laterality: N/A;  9:15am, asa 1-2   POLYPECTOMY  06/27/2022   Procedure: POLYPECTOMY;  Surgeon: Dolores Frame, MD;  Location: [facility];  Service: Gastroenterology;;  hot and cold snare   SCLEROTHERAPY  06/27/2022   Procedure: SCLEROTHERAPY;  Surgeon: Dolores Frame, MD;  Location: [facility];  Service: Gastroenterology;;   WISDOM TOOTH EXTRACTION      OB History   No obstetric history on file.      Home Medications    Prior to Admission medications   Medication Sig Start Date End Date Taking? Authorizing Provider  ibuprofen (ADVIL) 200 MG tablet Take 800 mg by mouth every 6 (six) hours as needed for moderate pain.    [provider]    Family History History reviewed. No pertinent family history.  Social History Social History   Tobacco Use   Smoking status: Never   Smokeless  tobacco: Never  Vaping Use   Vaping status: Never Used  Substance Use Topics   Alcohol use: Not Currently   Drug use: Never     Allergies   Patient has no known allergies.   Review of Systems Review of Systems Per HPI  Physical Exam Triage Vital Signs ED Triage Vitals  Encounter Vitals Group     BP 07/30/23 1215 127/84     Systolic BP Percentile --      Diastolic BP Percentile --      Pulse Rate 07/30/23 1215 79     Resp 07/30/23 1215 20     Temp 07/30/23 1215 (!) 97.5 F (36.4 C)     Temp Source 07/30/23 1215 Oral     SpO2 07/30/23 1215 97 %     Weight --      Height --      Head Circumference --      Peak Flow --      Pain Score 07/30/23 1218 5     Pain Loc --      Pain Education --      Exclude from Growth Chart --    No data found.  Updated Vital Signs BP 127/84 (BP Location: Right Arm)   Pulse 79   Temp (!) 97.5 F (36.4 C) (Oral)   Resp 20   LMP 06/21/2023   SpO2 97%   Visual Acuity Right Eye Distance:   Left Eye  Distance:   Bilateral Distance:    Right Eye Near:   Left Eye Near:    Bilateral Near:     Physical Exam Vitals and nursing note reviewed.  Constitutional:      General: She is not in acute distress.    Appearance: Normal appearance.  HENT:     Head: Normocephalic.  Eyes:     Extraocular Movements: Extraocular movements intact.     Conjunctiva/sclera: Conjunctivae normal.     Pupils: Pupils are equal, round, and reactive to light.  Cardiovascular:     Rate and Rhythm: Normal rate and regular rhythm.     Pulses: Normal pulses.     Heart sounds: Normal heart sounds.  Pulmonary:     Effort: Pulmonary effort is normal. No respiratory distress.     Breath sounds: Normal breath sounds. No stridor. No wheezing, rhonchi or rales.  Abdominal:     General: Bowel sounds are normal.     Palpations: Abdomen is soft.     Tenderness: There is no abdominal tenderness. There is no right CVA tenderness or left CVA tenderness.   Musculoskeletal:     Cervical back: Normal range of motion.  Skin:    General: Skin is warm and dry.  Neurological:     General: No focal deficit present.     Mental Status: She is alert and oriented to person, place, and time.  Psychiatric:        Mood and Affect: Mood normal.        Behavior: Behavior normal.      UC Treatments / Results  Labs (all labs ordered are listed, but only abnormal results are displayed) Labs Reviewed  POCT URINALYSIS DIP (MANUAL ENTRY)    EKG   Radiology No results found.  Procedures Procedures (including critical care time)  Medications Ordered in UC Medications - No data to display  Initial Impression / Assessment and Plan / UC Course  I have reviewed the triage vital signs and the nursing notes.  Pertinent labs & imaging results that were available during my care of the patient were reviewed by me and considered in my medical decision making (see chart for details).  Urinalysis is negative.  Discussed with patient that given findings of her urinalysis, urine culture is not indicated.  Discussion with patient regarding results and possible follow-up.  Supportive care recommendations were provided and discussed with the patient to include fluids, rest, avoiding caffeine, and developing a toileting schedule.  Patient was advised if symptoms worsen, recommend following up, offered the option of completing an e-visit.  Patient was also advised if the symptoms continue to recur, it is recommended that she follow-up with urology for further evaluation.  Patient was in agreement with this plan of care and verbalizes understanding.  All questions were answered.  Patient stable for discharge.   Final Clinical Impressions(s) / UC Diagnoses   Final diagnoses:  UTI symptoms     Discharge Instructions      Your urinalysis was negative today. Continue to drink plenty of fluids.  Try to drink at least 8-10 8 ounce glasses of water daily while  symptoms persist. Develop a toileting schedule that will allow you to urinate at least every 2 hours. Avoid caffeine such as tea, soda, or coffee while symptoms persist. As discussed, if your symptoms appear to be worsening, you can complete an e-visit using MyChart. If symptoms become recurrent, please follow-up with urology for further evaluation. Follow-up as needed.  ED Prescriptions   None    PDMP not reviewed this encounter.   Abran Cantor, NP 07/30/23 1351

## 2023-07-30 NOTE — Discharge Instructions (Addendum)
 Your urinalysis was negative today. Continue to drink plenty of fluids.  Try to drink at least 8-10 8 ounce glasses of water daily while symptoms persist. Develop a toileting schedule that will allow you to urinate at least every 2 hours. Avoid caffeine such as tea, soda, or coffee while symptoms persist. As discussed, if your symptoms appear to be worsening, you can complete an e-visit using MyChart. If symptoms become recurrent, please follow-up with urology for further evaluation. Follow-up as needed.

## 2023-09-22 IMAGING — CT CT RENAL STONE PROTOCOL
2 of 4 series · 16 of 46 positions shown, 18 images · non-contrast
Comparison: 09/05/2018

CLINICAL DATA: Right flank pain.  Kidney stones suspected.  Nausea.

EXAM:
CT ABDOMEN AND PELVIS WITHOUT CONTRAST
TECHNIQUE: Multidetector CT imaging of the abdomen and pelvis was performed
following the standard protocol without IV contrast.

[Series 2: axial st · axial · 0.90mm/px · z∈[+826,+1292]mm · 13 of 105 slices shown, 15 images]
[im 6/105  soft-tissue]
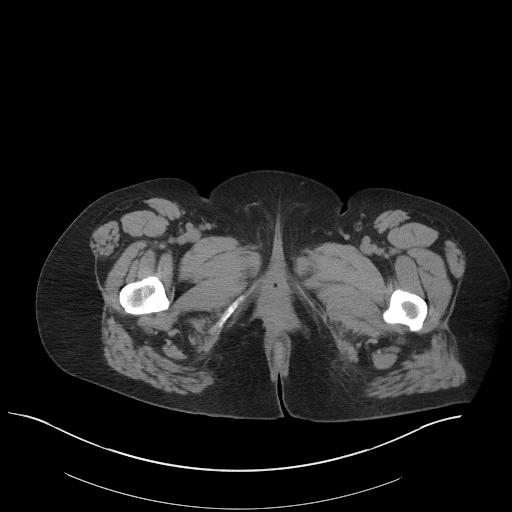
[im 6/105  bone]
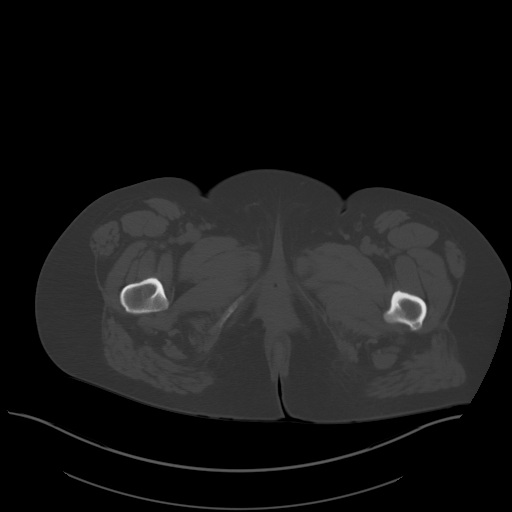
[im 12/105  soft-tissue]
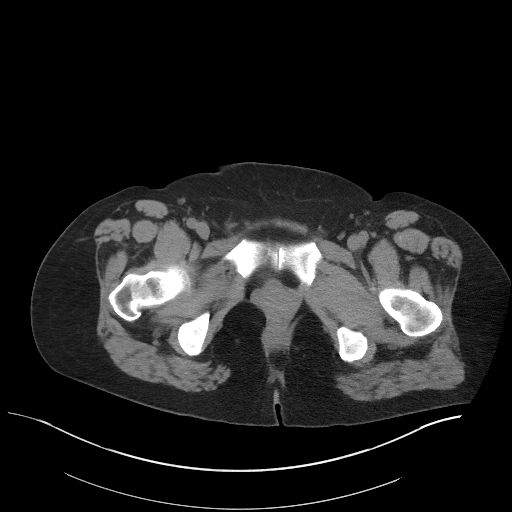
[im 24/105  soft-tissue]
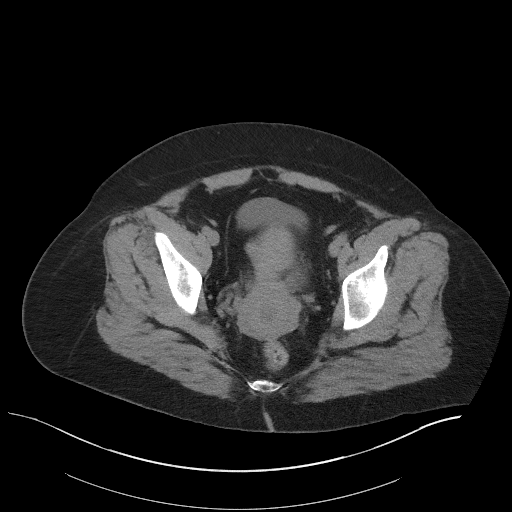
[im 29/105  soft-tissue]
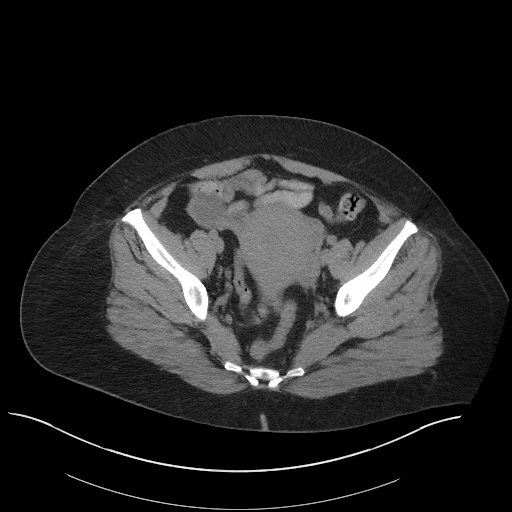
[im 35/105  soft-tissue]
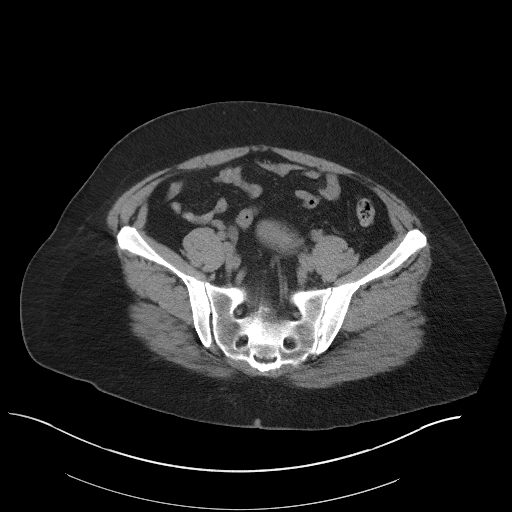
[im 47/105  soft-tissue]
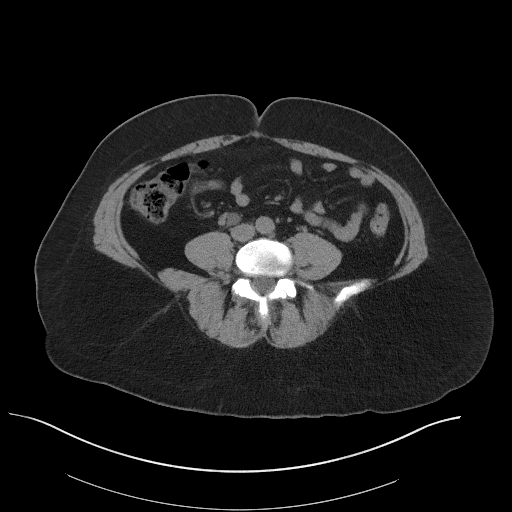
[im 53/105  soft-tissue]
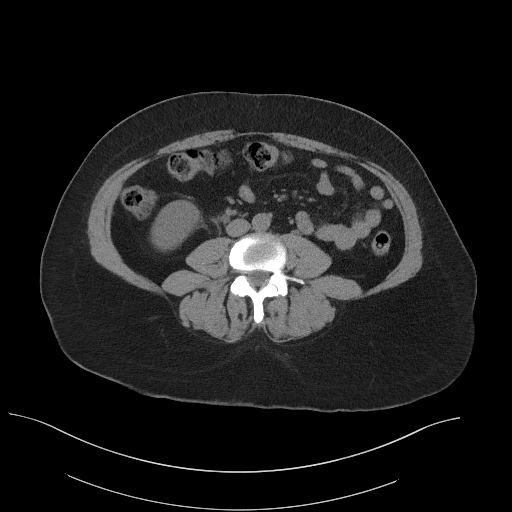
[im 58/105  soft-tissue]
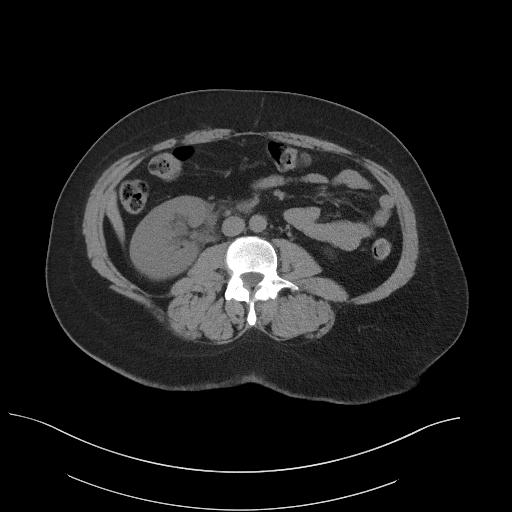
[im 70/105  soft-tissue]
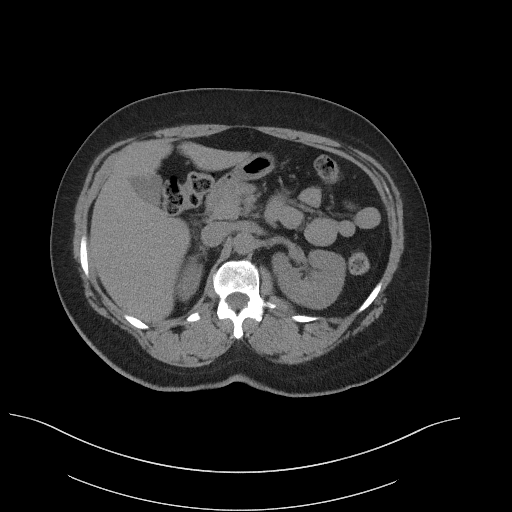
[im 70/105  bone]
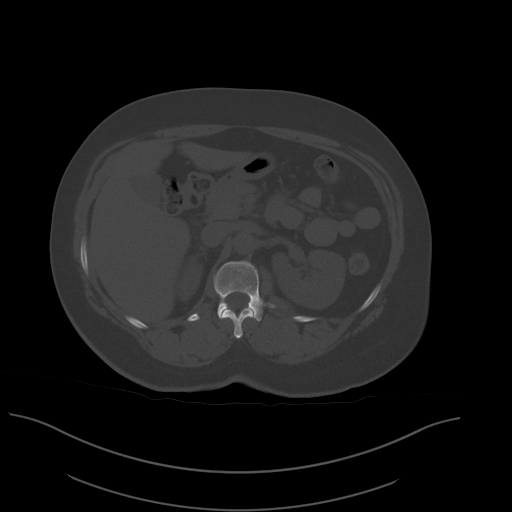
[im 76/105  soft-tissue]
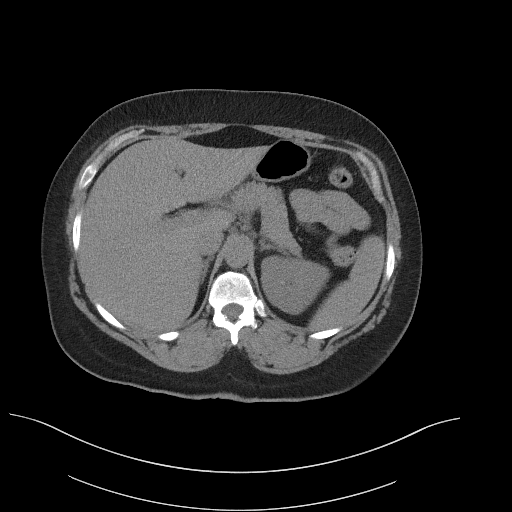
[im 81/105  soft-tissue]
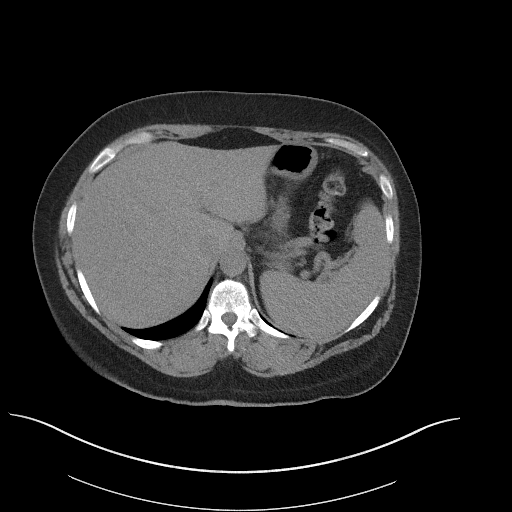
[im 93/105  soft-tissue]
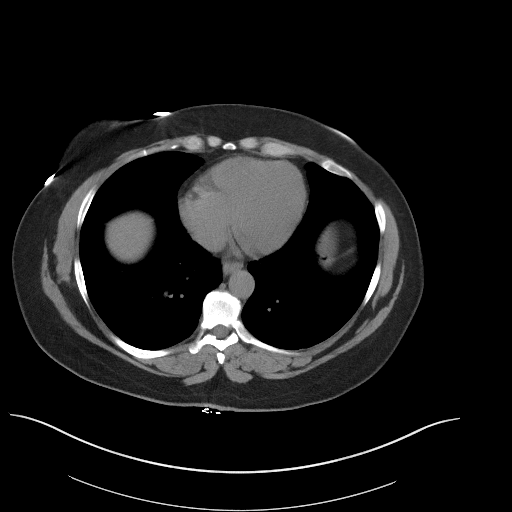
[im 99/105  soft-tissue]
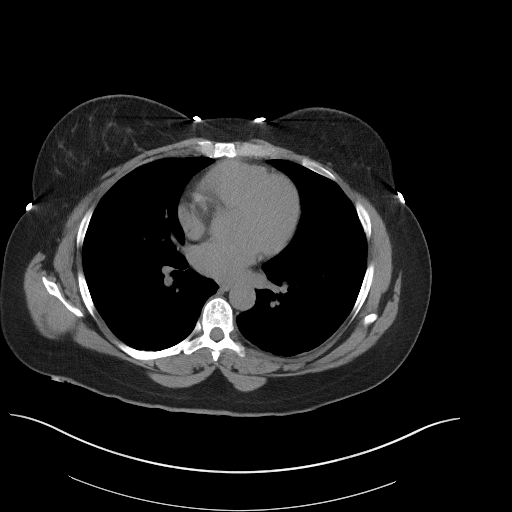

[Series 5: coronal st · coronal · 0.88mm/px · 3 of 112 slices shown]
[im 38/112  soft-tissue]
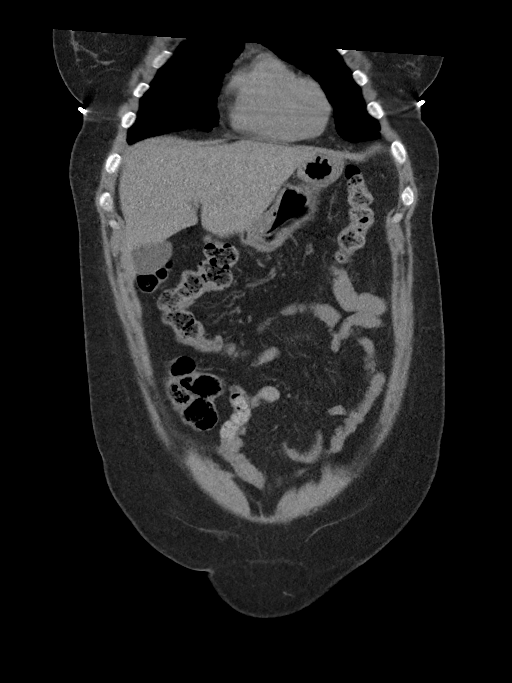
[im 50/112  soft-tissue]
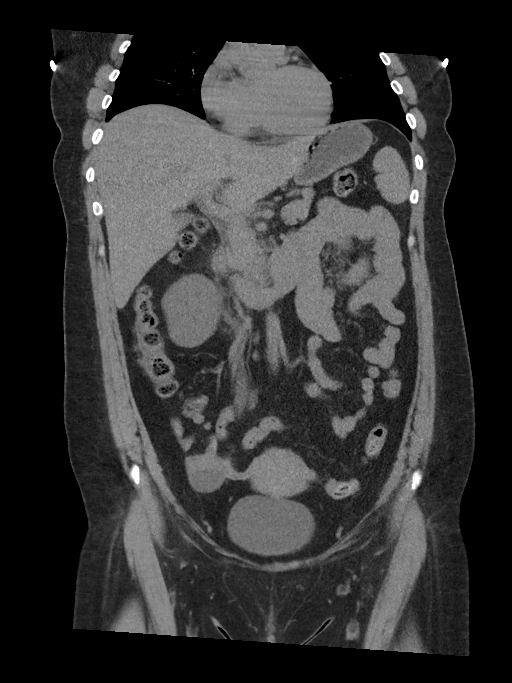
[im 62/112  soft-tissue]
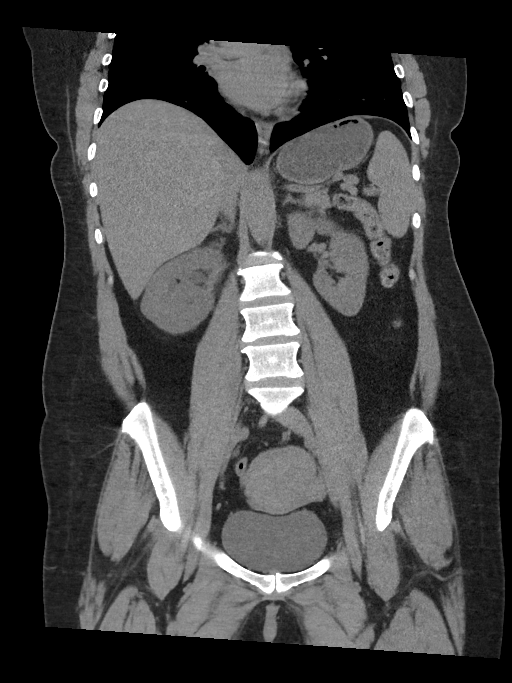

[16 of 46 positions shown; findings below may reference images not displayed]

FINDINGS: Lower chest: Mild dependent changes.

Hepatobiliary: No focal liver abnormality is seen. No gallstones,
gallbladder wall thickening, or biliary dilatation.

Pancreas: Unremarkable. No pancreatic ductal dilatation or
surrounding inflammatory changes.

Spleen: Normal in size without focal abnormality.

Adrenals/Urinary Tract: No adrenal gland nodules. 5 mm stone in the
distal right ureter at the ureterovesical junction with moderate
proximal hydronephrosis and hydroureter. Stranding around the right
kidney and right renal pelvis and ureter. Additional tiny 2 mm
stones are demonstrated in both kidneys. No hydronephrosis on the
left. Bladder is unremarkable.

Stomach/Bowel: Stomach, small bowel, and colon are not abnormally
distended. No wall thickening or inflammatory changes are
appreciated. Appendix is normal.

Vascular/Lymphatic: Minimal aortic calcification. No aneurysm. No
significant lymphadenopathy.

Reproductive: Uterus and ovaries are not enlarged.

Other: No free air or free fluid in the abdomen. Abdominal wall
musculature appears intact.

Musculoskeletal: No acute or significant osseous findings.
IMPRESSION: 1. 5 mm stone in the distal right ureter with moderate proximal
obstruction.
2. Additional nonobstructing stones in both kidneys.
3. Minimal aortic atherosclerosis.

## 2023-10-16 DIAGNOSIS — Z1231 Encounter for screening mammogram for malignant neoplasm of breast: Secondary | ICD-10-CM | POA: Diagnosis not present

## 2023-10-21 ENCOUNTER — Other Ambulatory Visit: Payer: Self-pay | Admitting: Obstetrics and Gynecology

## 2023-10-21 DIAGNOSIS — R928 Other abnormal and inconclusive findings on diagnostic imaging of breast: Secondary | ICD-10-CM

## 2023-10-30 DIAGNOSIS — R7989 Other specified abnormal findings of blood chemistry: Secondary | ICD-10-CM | POA: Diagnosis not present

## 2023-10-30 DIAGNOSIS — E559 Vitamin D deficiency, unspecified: Secondary | ICD-10-CM | POA: Diagnosis not present

## 2023-10-31 ENCOUNTER — Ambulatory Visit
Admission: RE | Admit: 2023-10-31 | Discharge: 2023-10-31 | Disposition: A | Discharge status: Home / Self Care | Source: Ambulatory Visit | Attending: Obstetrics and Gynecology | Admitting: Obstetrics and Gynecology

## 2023-10-31 DIAGNOSIS — R590 Localized enlarged lymph nodes: Secondary | ICD-10-CM | POA: Diagnosis not present

## 2023-10-31 DIAGNOSIS — R928 Other abnormal and inconclusive findings on diagnostic imaging of breast: Secondary | ICD-10-CM

## 2023-11-14 DIAGNOSIS — Z124 Encounter for screening for malignant neoplasm of cervix: Secondary | ICD-10-CM | POA: Diagnosis not present

## 2023-11-14 DIAGNOSIS — Z6834 Body mass index (BMI) 34.0-34.9, adult: Secondary | ICD-10-CM | POA: Diagnosis not present

## 2023-11-14 DIAGNOSIS — Z01419 Encounter for gynecological examination (general) (routine) without abnormal findings: Secondary | ICD-10-CM | POA: Diagnosis not present

## 2023-11-15 DIAGNOSIS — Z0000 Encounter for general adult medical examination without abnormal findings: Secondary | ICD-10-CM | POA: Diagnosis not present

## 2023-11-15 DIAGNOSIS — Z1331 Encounter for screening for depression: Secondary | ICD-10-CM | POA: Diagnosis not present

## 2023-11-15 DIAGNOSIS — Z1339 Encounter for screening examination for other mental health and behavioral disorders: Secondary | ICD-10-CM | POA: Diagnosis not present

## 2023-11-15 DIAGNOSIS — L989 Disorder of the skin and subcutaneous tissue, unspecified: Secondary | ICD-10-CM | POA: Diagnosis not present

## 2023-11-21 ENCOUNTER — Other Ambulatory Visit (HOSPITAL_BASED_OUTPATIENT_CLINIC_OR_DEPARTMENT_OTHER): Payer: Self-pay | Admitting: Internal Medicine

## 2023-11-21 DIAGNOSIS — E785 Hyperlipidemia, unspecified: Secondary | ICD-10-CM

## 2023-12-19 DIAGNOSIS — M25572 Pain in left ankle and joints of left foot: Secondary | ICD-10-CM | POA: Diagnosis not present

## 2024-01-13 DIAGNOSIS — Z1283 Encounter for screening for malignant neoplasm of skin: Secondary | ICD-10-CM | POA: Diagnosis not present

## 2024-01-13 DIAGNOSIS — D225 Melanocytic nevi of trunk: Secondary | ICD-10-CM | POA: Diagnosis not present

## 2024-01-17 DIAGNOSIS — G5762 Lesion of plantar nerve, left lower limb: Secondary | ICD-10-CM | POA: Diagnosis not present

## 2024-02-19 ENCOUNTER — Encounter (INDEPENDENT_AMBULATORY_CARE_PROVIDER_SITE_OTHER): Payer: Self-pay | Admitting: Gastroenterology

## 2024-02-28 DIAGNOSIS — G5762 Lesion of plantar nerve, left lower limb: Secondary | ICD-10-CM | POA: Diagnosis not present

## 2024-04-13 DIAGNOSIS — Z6834 Body mass index (BMI) 34.0-34.9, adult: Secondary | ICD-10-CM | POA: Diagnosis not present

## 2024-04-13 DIAGNOSIS — J069 Acute upper respiratory infection, unspecified: Secondary | ICD-10-CM | POA: Diagnosis not present

## 2024-04-13 DIAGNOSIS — U071 COVID-19: Secondary | ICD-10-CM | POA: Diagnosis not present

## 2024-04-13 DIAGNOSIS — I10 Essential (primary) hypertension: Secondary | ICD-10-CM | POA: Diagnosis not present
